# Patient Record
Sex: Female | Born: 1945
Health system: Southern US, Community
[De-identification: ages and names within clinical notes are randomized; demographics above are authoritative.]

## PROBLEM LIST (undated history)

## (undated) DIAGNOSIS — R062 Wheezing: Secondary | ICD-10-CM

## (undated) DIAGNOSIS — M1990 Unspecified osteoarthritis, unspecified site: Secondary | ICD-10-CM

## (undated) DIAGNOSIS — IMO0001 Reserved for inherently not codable concepts without codable children: Secondary | ICD-10-CM

## (undated) DIAGNOSIS — T8859XA Other complications of anesthesia, initial encounter: Secondary | ICD-10-CM

## (undated) DIAGNOSIS — R519 Headache, unspecified: Secondary | ICD-10-CM

## (undated) DIAGNOSIS — R112 Nausea with vomiting, unspecified: Secondary | ICD-10-CM

## (undated) DIAGNOSIS — C4491 Basal cell carcinoma of skin, unspecified: Secondary | ICD-10-CM

## (undated) DIAGNOSIS — J45909 Unspecified asthma, uncomplicated: Secondary | ICD-10-CM

## (undated) DIAGNOSIS — K219 Gastro-esophageal reflux disease without esophagitis: Secondary | ICD-10-CM

## (undated) DIAGNOSIS — I10 Essential (primary) hypertension: Secondary | ICD-10-CM

## (undated) DIAGNOSIS — C801 Malignant (primary) neoplasm, unspecified: Secondary | ICD-10-CM

## (undated) DIAGNOSIS — M810 Age-related osteoporosis without current pathological fracture: Secondary | ICD-10-CM

## (undated) DIAGNOSIS — S92902A Unspecified fracture of left foot, initial encounter for closed fracture: Secondary | ICD-10-CM

## (undated) DIAGNOSIS — I499 Cardiac arrhythmia, unspecified: Secondary | ICD-10-CM

## (undated) DIAGNOSIS — E785 Hyperlipidemia, unspecified: Secondary | ICD-10-CM

## (undated) DIAGNOSIS — T4145XA Adverse effect of unspecified anesthetic, initial encounter: Secondary | ICD-10-CM

## (undated) DIAGNOSIS — Z9889 Other specified postprocedural states: Secondary | ICD-10-CM

## (undated) DIAGNOSIS — K589 Irritable bowel syndrome without diarrhea: Secondary | ICD-10-CM

## (undated) DIAGNOSIS — Z8719 Personal history of other diseases of the digestive system: Secondary | ICD-10-CM

## (undated) HISTORY — PX: MOUTH SURGERY: SHX715

## (undated) HISTORY — PX: ABDOMINAL HYSTERECTOMY: SHX81

## (undated) HISTORY — PX: NASAL SINUS SURGERY: SHX719

## (undated) HISTORY — PX: TONSILLECTOMY: SUR1361

## (undated) HISTORY — DX: Basal cell carcinoma of skin, unspecified: C44.91

## (undated) HISTORY — PX: FOOT SURGERY: SHX648

## (undated) HISTORY — PX: HAND SURGERY: SHX662

## (undated) HISTORY — PX: HIP ARTHROPLASTY: SHX981

## (undated) HISTORY — PX: OTHER SURGICAL HISTORY: SHX169

## (undated) HISTORY — PX: ESOPHAGOGASTRODUODENOSCOPY: SHX1529

---

## 2004-10-03 ENCOUNTER — Ambulatory Visit: Payer: Self-pay | Admitting: Internal Medicine

## 2005-01-09 ENCOUNTER — Other Ambulatory Visit: Payer: Self-pay

## 2005-01-11 ENCOUNTER — Ambulatory Visit: Payer: Self-pay | Admitting: Unknown Physician Specialty

## 2005-11-12 ENCOUNTER — Ambulatory Visit: Payer: Self-pay | Admitting: Internal Medicine

## 2006-10-13 ENCOUNTER — Ambulatory Visit: Payer: Self-pay | Admitting: Internal Medicine

## 2007-01-05 ENCOUNTER — Ambulatory Visit: Payer: Self-pay | Admitting: Unknown Physician Specialty

## 2007-10-29 ENCOUNTER — Ambulatory Visit: Payer: Self-pay | Admitting: Internal Medicine

## 2008-11-24 ENCOUNTER — Ambulatory Visit: Payer: Self-pay | Admitting: Internal Medicine

## 2009-12-07 ENCOUNTER — Ambulatory Visit: Payer: Self-pay | Admitting: Internal Medicine

## 2010-12-12 ENCOUNTER — Ambulatory Visit: Payer: Self-pay | Admitting: Internal Medicine

## 2010-12-24 ENCOUNTER — Ambulatory Visit: Payer: Self-pay | Admitting: Internal Medicine

## 2011-12-05 ENCOUNTER — Ambulatory Visit: Payer: Self-pay | Admitting: Podiatry

## 2012-01-07 ENCOUNTER — Ambulatory Visit: Payer: Self-pay | Admitting: Internal Medicine

## 2013-02-10 ENCOUNTER — Ambulatory Visit: Payer: Self-pay | Admitting: Internal Medicine

## 2013-12-03 ENCOUNTER — Ambulatory Visit: Payer: Self-pay | Admitting: Unknown Physician Specialty

## 2013-12-08 LAB — PATHOLOGY REPORT

## 2014-03-14 ENCOUNTER — Ambulatory Visit: Payer: Self-pay | Admitting: Internal Medicine

## 2015-03-16 ENCOUNTER — Ambulatory Visit
Admit: 2015-03-16 | Disposition: A | Discharge status: Home / Self Care | Payer: Self-pay | Attending: Internal Medicine | Admitting: Internal Medicine

## 2015-08-11 ENCOUNTER — Emergency Department
Admission: EM | Admit: 2015-08-11 | Discharge: 2015-08-11 | Disposition: A | Discharge status: Home / Self Care | Payer: Medicare PPO | Attending: Emergency Medicine | Admitting: Emergency Medicine

## 2015-08-11 ENCOUNTER — Encounter: Payer: Self-pay | Admitting: Emergency Medicine

## 2015-08-11 ENCOUNTER — Emergency Department: Payer: Medicare PPO

## 2015-08-11 DIAGNOSIS — I10 Essential (primary) hypertension: Secondary | ICD-10-CM | POA: Diagnosis not present

## 2015-08-11 DIAGNOSIS — J45901 Unspecified asthma with (acute) exacerbation: Secondary | ICD-10-CM | POA: Diagnosis not present

## 2015-08-11 DIAGNOSIS — R079 Chest pain, unspecified: Secondary | ICD-10-CM | POA: Insufficient documentation

## 2015-08-11 HISTORY — DX: Essential (primary) hypertension: I10

## 2015-08-11 HISTORY — DX: Irritable bowel syndrome, unspecified: K58.9

## 2015-08-11 HISTORY — DX: Gastro-esophageal reflux disease without esophagitis: K21.9

## 2015-08-11 HISTORY — DX: Unspecified asthma, uncomplicated: J45.909

## 2015-08-11 LAB — CBC WITH DIFFERENTIAL/PLATELET
BASOS ABS: 0.1 10*3/uL (ref 0–0.1)
BASOS PCT: 1 %
EOS ABS: 0.1 10*3/uL (ref 0–0.7)
EOS PCT: 1 %
HCT: 37.5 % (ref 35.0–47.0)
Hemoglobin: 12.7 g/dL (ref 12.0–16.0)
Lymphocytes Relative: 19 %
Lymphs Abs: 1.7 10*3/uL (ref 1.0–3.6)
MCH: 30.5 pg (ref 26.0–34.0)
MCHC: 33.8 g/dL (ref 32.0–36.0)
MCV: 90.1 fL (ref 80.0–100.0)
MONO ABS: 0.8 10*3/uL (ref 0.2–0.9)
MONOS PCT: 9 %
NEUTROS ABS: 5.9 10*3/uL (ref 1.4–6.5)
Neutrophils Relative %: 70 %
PLATELETS: 325 10*3/uL (ref 150–440)
RBC: 4.16 MIL/uL (ref 3.80–5.20)
RDW: 14 % (ref 11.5–14.5)
WBC: 8.6 10*3/uL (ref 3.6–11.0)

## 2015-08-11 LAB — COMPREHENSIVE METABOLIC PANEL
ALBUMIN: 3.6 g/dL (ref 3.5–5.0)
ALK PHOS: 64 U/L (ref 38–126)
ALT: 22 U/L (ref 14–54)
ANION GAP: 10 (ref 5–15)
AST: 20 U/L (ref 15–41)
BILIRUBIN TOTAL: 0.3 mg/dL (ref 0.3–1.2)
BUN: 25 mg/dL — AB (ref 6–20)
CALCIUM: 9.1 mg/dL (ref 8.9–10.3)
CO2: 31 mmol/L (ref 22–32)
CREATININE: 1.08 mg/dL — AB (ref 0.44–1.00)
Chloride: 90 mmol/L — ABNORMAL LOW (ref 101–111)
GFR calc Af Amer: 59 mL/min — ABNORMAL LOW (ref >60)
GFR calc non Af Amer: 51 mL/min — ABNORMAL LOW (ref >60)
GLUCOSE: 92 mg/dL (ref 65–99)
Potassium: 3.3 mmol/L — ABNORMAL LOW (ref 3.5–5.1)
Sodium: 131 mmol/L — ABNORMAL LOW (ref 135–145)
TOTAL PROTEIN: 6.5 g/dL (ref 6.5–8.1)

## 2015-08-11 LAB — TROPONIN I

## 2015-08-11 MED ORDER — ASPIRIN EC 81 MG PO TBEC: 81.0000 mg | DELAYED_RELEASE_TABLET | Freq: Every day | ORAL | Status: AC

## 2015-08-11 MED ORDER — ASPIRIN 81 MG PO CHEW
324.0000 mg | CHEWABLE_TABLET | Freq: Once | ORAL | Status: AC
  Administered 2015-08-11: 324 mg via ORAL
  Filled 2015-08-11: qty 4

## 2015-08-11 NOTE — Discharge Instructions (Signed)

## 2015-08-11 NOTE — ED Notes (Signed)
Reports short episodes of cp off and on x 1 wk, states afterwards she feels very weak.  Denies cp at this time.

## 2015-08-11 NOTE — ED Provider Notes (Addendum)
[facility] Emergency Department Provider Note  ____________________________________________  Time seen: Approximately 093 PM  I have reviewed the triage vital signs and the nursing notes.   HISTORY  Chief Complaint Chest Pain    HPI Tracy Harvey is a 69 y.o. female with a history of hypertension and asthma who is presenting today with on and off chest pain over the past month. She said the pain is midsternal and nonradiating. She said it feels like a heaviness and can last from 5-10 minutes. It can happen at any time and is not worsened by exertion. 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 She denies any pain at this time. She says she [facility] doctor [facility] urgent [facility] and then she was sent to the emergency department. She denies any nausea vomiting or diaphoresis. Denies ever smoking. Has a history of heart disease in her family. Says she thought this may be indigestion but takes omeprazole as well as Tums without relief. Does have a history of vital hernia. Says that she has had chest pain in the 90s and had an echocardiogram that did not reveal any abnormality. However, and has not had any issues since then. Does not feel stressed at this time.   Past Medical History  Diagnosis Date  . GERD (gastroesophageal reflux disease)   . IBS (irritable bowel syndrome)   . Asthma   . Hypertension     There are no active problems to display for this patient.   Past Surgical History  Procedure Laterality Date  . Nasal sinus surgery      No current outpatient prescriptions on file.  Allergies Review of patient's allergies indicates not on file.  History reviewed. No pertinent family history.  Social History Social History  Substance Use  Topics  . Smoking status: Never Smoker   . Smokeless tobacco: None  . Alcohol Use: None    Review of Systems Constitutional: No fever/chills Eyes: No visual changes. ENT: No sore throat. Cardiovascular: As above Respiratory: Denies shortness of breath. Gastrointestinal: No abdominal pain.  No nausea, no vomiting.  No diarrhea.  No constipation. Genitourinary: Negative for dysuria. Musculoskeletal: Negative for back pain. Skin: Negative for rash. Neurological: Negative for headaches, focal weakness or numbness.  10-point ROS otherwise negative.  ____________________________________________   PHYSICAL EXAM:  VITAL SIGNS: ED Triage Vitals  Enc Vitals Group     BP 08/11/15 1716 164/77 mmHg     Pulse Rate 08/11/15 1716 66     Resp 08/11/15 1716 18     Temp 08/11/15 1716 98.6 F (37 C)     Temp Source 08/11/15 1716 Oral     SpO2 08/11/15 1716 98 %     Weight 08/11/15 1716 127 lb (57.607 kg)     Height 08/11/15 1716 5\' 2"  (1.575 m)     Head Cir --      Peak Flow --      Pain Score 08/11/15 1707 0     Pain Loc --      Pain Edu? --      Excl. in [place]? --     Constitutional: Alert and oriented. Well appearing and in no acute distress. Eyes: Conjunctivae are normal. PERRL. EOMI. Head: Atraumatic. Nose: No congestion/rhinnorhea. Mouth/Throat: Mucous membranes  are moist.  Oropharynx non-erythematous. Neck: No stridor.   Cardiovascular: Normal rate, regular rhythm. Grossly normal heart sounds.  Good peripheral circulation. Respiratory: Normal respiratory effort.  No retractions. Lungs CTAB. Gastrointestinal: Soft and nontender. No distention. No abdominal bruits. No CVA tenderness. Musculoskeletal: No lower extremity tenderness nor edema.  No joint effusions. Neurologic:  Normal speech and language. No gross focal neurologic deficits are appreciated. No gait instability. Skin:  Skin is warm, dry and intact. No rash noted. Psychiatric: Mood and affect are normal. Speech and  behavior are normal.  ____________________________________________   LABS (all labs ordered are listed, but only abnormal results are displayed)  Labs Reviewed  COMPREHENSIVE METABOLIC PANEL - Abnormal; Notable for the following:    Sodium 131 (*)    Potassium 3.3 (*)    Chloride 90 (*)    BUN 25 (*)    Creatinine, Ser 1.08 (*)    GFR calc non Af Amer 51 (*)    GFR calc Af Amer 59 (*)    All other components within normal limits  CBC WITH DIFFERENTIAL/PLATELET  TROPONIN I   ____________________________________________  EKG  ED ECG REPORT I, Doran Stabler, the attending physician, personally viewed and interpreted this ECG.   Date: 08/11/2015  EKG Time: 1709  Rate: 69  Rhythm: normal sinus rhythm with PACs  Axis: Normal axis  Intervals:none  ST&T Change: No ST elevation or depression. No abnormal T-wave inversion. Criteria for LVH  ____________________________________________  RADIOLOGY  No acute disease on her chest x-ray. ____________________________________________   PROCEDURES  ____________________________________________   INITIAL IMPRESSION / ASSESSMENT AND PLAN / ED COURSE  Pertinent labs & imaging results that were available during my care of the patient were reviewed by me and considered in my medical decision making (see chart for details).  ----------------------------------------- 10:21 PM on 08/11/2015 -----------------------------------------  Discussed the case with Dr. Nehemiah Massed who agrees to the patient in clinic on Monday at 1:30 PM. Discussed with the patient as well as her family member who is at the bedside. The patient understands reason for return such as worsening chest pain or any other concerning symptoms. After one month of intermittent pains with a very reassuring workup I believe the patient may be treated safely as an outpatient. She does have some risk factors however feels well at this time and has not had any worsening of  her symptoms. She just says it feels the same and finally decided she should have it checked out after it did not go away. The patient says she will be able to make the appointment this Monday 1:30 PM. ____________________________________________   FINAL CLINICAL IMPRESSION(S) / ED DIAGNOSES  Final diagnoses:  Chest pain      Orbie Pyo, MD 08/11/15 2223  We'll start patient on a baby aspirin.   Orbie Pyo, MD 08/11/15 2225

## 2016-01-23 ENCOUNTER — Encounter: Payer: Self-pay | Admitting: *Deleted

## 2016-01-25 NOTE — Pre-Procedure Instructions (Signed)
CLEARED BY DR Pryor Ochoa

## 2016-02-04 NOTE — H&P (Signed)
See scanned note.

## 2016-02-05 ENCOUNTER — Ambulatory Visit: Payer: Medicare PPO | Admitting: Anesthesiology

## 2016-02-05 ENCOUNTER — Ambulatory Visit
Admission: RE | Admit: 2016-02-05 | Discharge: 2016-02-05 | Disposition: A | Discharge status: Home / Self Care | Payer: Medicare PPO | Source: Ambulatory Visit | Attending: Ophthalmology | Admitting: Ophthalmology

## 2016-02-05 ENCOUNTER — Encounter
Admission: RE | Disposition: A | Discharge status: Home / Self Care | Payer: Self-pay | Source: Ambulatory Visit | Attending: Ophthalmology

## 2016-02-05 ENCOUNTER — Encounter: Payer: Self-pay | Admitting: Anesthesiology

## 2016-02-05 DIAGNOSIS — Z7982 Long term (current) use of aspirin: Secondary | ICD-10-CM | POA: Insufficient documentation

## 2016-02-05 DIAGNOSIS — Z881 Allergy status to other antibiotic agents status: Secondary | ICD-10-CM | POA: Insufficient documentation

## 2016-02-05 DIAGNOSIS — Z9889 Other specified postprocedural states: Secondary | ICD-10-CM | POA: Insufficient documentation

## 2016-02-05 DIAGNOSIS — J301 Allergic rhinitis due to pollen: Secondary | ICD-10-CM | POA: Insufficient documentation

## 2016-02-05 DIAGNOSIS — J45909 Unspecified asthma, uncomplicated: Secondary | ICD-10-CM | POA: Insufficient documentation

## 2016-02-05 DIAGNOSIS — M1990 Unspecified osteoarthritis, unspecified site: Secondary | ICD-10-CM | POA: Diagnosis not present

## 2016-02-05 DIAGNOSIS — Z791 Long term (current) use of non-steroidal anti-inflammatories (NSAID): Secondary | ICD-10-CM | POA: Insufficient documentation

## 2016-02-05 DIAGNOSIS — M810 Age-related osteoporosis without current pathological fracture: Secondary | ICD-10-CM | POA: Insufficient documentation

## 2016-02-05 DIAGNOSIS — Z79899 Other long term (current) drug therapy: Secondary | ICD-10-CM | POA: Diagnosis not present

## 2016-02-05 DIAGNOSIS — H2511 Age-related nuclear cataract, right eye: Secondary | ICD-10-CM | POA: Diagnosis not present

## 2016-02-05 DIAGNOSIS — H269 Unspecified cataract: Secondary | ICD-10-CM | POA: Diagnosis present

## 2016-02-05 HISTORY — DX: Unspecified osteoarthritis, unspecified site: M19.90

## 2016-02-05 HISTORY — DX: Adverse effect of unspecified anesthetic, initial encounter: T41.45XA

## 2016-02-05 HISTORY — DX: Wheezing: R06.2

## 2016-02-05 HISTORY — DX: Unspecified fracture of left foot, initial encounter for closed fracture: S92.902A

## 2016-02-05 HISTORY — PX: CATARACT EXTRACTION W/PHACO: SHX586

## 2016-02-05 HISTORY — DX: Personal history of other diseases of the digestive system: Z87.19

## 2016-02-05 HISTORY — DX: Other complications of anesthesia, initial encounter: T88.59XA

## 2016-02-05 HISTORY — DX: Other specified postprocedural states: Z98.890

## 2016-02-05 HISTORY — DX: Nausea with vomiting, unspecified: R11.2

## 2016-02-05 HISTORY — DX: Reserved for inherently not codable concepts without codable children: IMO0001

## 2016-02-05 SURGERY — PHACOEMULSIFICATION, CATARACT, WITH IOL INSERTION
Anesthesia: Monitor Anesthesia Care | Site: Eye | Laterality: Right | Wound class: Clean

## 2016-02-05 MED ORDER — CEFUROXIME OPHTHALMIC INJECTION 1 MG/0.1 ML
INJECTION | OPHTHALMIC | Status: DC | PRN
  Administered 2016-02-05: 0.1 mL via INTRACAMERAL

## 2016-02-05 MED ORDER — OXYCODONE HCL 5 MG/5ML PO SOLN: 5.0000 mg | Freq: Once | ORAL | Status: DC | PRN

## 2016-02-05 MED ORDER — MIDAZOLAM HCL 2 MG/2ML IJ SOLN
INTRAMUSCULAR | Status: DC | PRN
  Administered 2016-02-05 (×2): 0.5 mg via INTRAVENOUS

## 2016-02-05 MED ORDER — PHENYLEPHRINE HCL 10 % OP SOLN
OPHTHALMIC | Status: AC
  Administered 2016-02-05: 1 [drp] via OPHTHALMIC
  Filled 2016-02-05: qty 5

## 2016-02-05 MED ORDER — LIDOCAINE HCL (PF) 4 % IJ SOLN
INTRAMUSCULAR | Status: DC | PRN
  Administered 2016-02-05: 4 mL via OPHTHALMIC

## 2016-02-05 MED ORDER — MOXIFLOXACIN HCL 0.5 % OP SOLN
1.0000 [drp] | OPHTHALMIC | Status: AC | PRN
  Administered 2016-02-05 (×3): 1 [drp] via OPHTHALMIC

## 2016-02-05 MED ORDER — LIDOCAINE HCL (PF) 4 % IJ SOLN
INTRAOCULAR | Status: DC | PRN
  Administered 2016-02-05: .5 mL via OPHTHALMIC

## 2016-02-05 MED ORDER — MOXIFLOXACIN HCL 0.5 % OP SOLN
OPHTHALMIC | Status: AC
  Administered 2016-02-05: 1 [drp] via OPHTHALMIC
  Filled 2016-02-05: qty 3

## 2016-02-05 MED ORDER — CYCLOPENTOLATE HCL 2 % OP SOLN
1.0000 [drp] | OPHTHALMIC | Status: AC | PRN
  Administered 2016-02-05 (×4): 1 [drp] via OPHTHALMIC

## 2016-02-05 MED ORDER — ONDANSETRON HCL 4 MG/2ML IJ SOLN
INTRAMUSCULAR | Status: DC | PRN
  Administered 2016-02-05: 4 mg via INTRAVENOUS

## 2016-02-05 MED ORDER — PHENYLEPHRINE HCL 10 % OP SOLN
1.0000 [drp] | OPHTHALMIC | Status: AC | PRN
  Administered 2016-02-05 (×4): 1 [drp] via OPHTHALMIC

## 2016-02-05 MED ORDER — LIDOCAINE HCL (PF) 4 % IJ SOLN
INTRAMUSCULAR | Status: AC
Start: 2016-02-05 — End: 2016-02-05
  Filled 2016-02-05: qty 5

## 2016-02-05 MED ORDER — CYCLOPENTOLATE HCL 2 % OP SOLN
OPHTHALMIC | Status: AC
  Administered 2016-02-05: 1 [drp] via OPHTHALMIC
  Filled 2016-02-05: qty 2

## 2016-02-05 MED ORDER — CEFUROXIME OPHTHALMIC INJECTION 1 MG/0.1 ML
INJECTION | OPHTHALMIC | Status: AC
  Filled 2016-02-05: qty 0.1

## 2016-02-05 MED ORDER — TETRACAINE HCL 0.5 % OP SOLN
OPHTHALMIC | Status: DC | PRN
  Administered 2016-02-05: 1 [drp] via OPHTHALMIC

## 2016-02-05 MED ORDER — EPINEPHRINE HCL 1 MG/ML IJ SOLN
INTRAMUSCULAR | Status: DC | PRN
  Administered 2016-02-05: 1 mL via OPHTHALMIC

## 2016-02-05 MED ORDER — OXYCODONE HCL 5 MG PO TABS: 5.0000 mg | ORAL_TABLET | Freq: Once | ORAL | Status: DC | PRN

## 2016-02-05 MED ORDER — MOXIFLOXACIN HCL 0.5 % OP SOLN
OPHTHALMIC | Status: DC | PRN
  Administered 2016-02-05: 1 [drp] via OPHTHALMIC

## 2016-02-05 MED ORDER — ALFENTANIL 500 MCG/ML IJ INJ
INJECTION | INTRAMUSCULAR | Status: DC | PRN
  Administered 2016-02-05: 500 ug via INTRAVENOUS

## 2016-02-05 MED ORDER — SODIUM CHLORIDE 0.9 % IV SOLN
INTRAVENOUS | Status: DC
  Administered 2016-02-05: 09:00:00 via INTRAVENOUS

## 2016-02-05 MED ORDER — TETRACAINE HCL 0.5 % OP SOLN
OPHTHALMIC | Status: AC
  Filled 2016-02-05: qty 2

## 2016-02-05 MED ORDER — MEPERIDINE HCL 25 MG/ML IJ SOLN: 6.2500 mg | INTRAMUSCULAR | Status: DC | PRN

## 2016-02-05 MED ORDER — NA CHONDROIT SULF-NA HYALURON 40-17 MG/ML IO SOLN
INTRAOCULAR | Status: DC | PRN
  Administered 2016-02-05: 1 mL via INTRAOCULAR

## 2016-02-05 MED ORDER — POVIDONE-IODINE 5 % OP SOLN
OPHTHALMIC | Status: AC
  Filled 2016-02-05: qty 30

## 2016-02-05 MED ORDER — CARBACHOL 0.01 % IO SOLN
INTRAOCULAR | Status: DC | PRN
  Administered 2016-02-05: 0.5 mL via INTRAOCULAR

## 2016-02-05 MED ORDER — BUPIVACAINE HCL (PF) 0.75 % IJ SOLN
INTRAMUSCULAR | Status: AC
  Filled 2016-02-05: qty 10

## 2016-02-05 MED ORDER — HYALURONIDASE HUMAN 150 UNIT/ML IJ SOLN
INTRAMUSCULAR | Status: AC
  Filled 2016-02-05: qty 1

## 2016-02-05 MED ORDER — POVIDONE-IODINE 5 % OP SOLN
OPHTHALMIC | Status: DC | PRN
  Administered 2016-02-05: 1 via OPHTHALMIC

## 2016-02-05 MED ORDER — EPINEPHRINE HCL 1 MG/ML IJ SOLN
INTRAMUSCULAR | Status: AC
  Filled 2016-02-05: qty 2

## 2016-02-05 MED ORDER — NA CHONDROIT SULF-NA HYALURON 40-17 MG/ML IO SOLN
INTRAOCULAR | Status: AC
  Filled 2016-02-05: qty 1

## 2016-02-05 MED ORDER — ONDANSETRON HCL 4 MG/2ML IJ SOLN: 4.0000 mg | Freq: Once | INTRAMUSCULAR | Status: DC | PRN

## 2016-02-05 SURGICAL SUPPLY — 30 items
2.6 SLIT KNIFE ×3 IMPLANT
CANNULA ANT/CHMB 27GA (MISCELLANEOUS) ×3 IMPLANT
CORD BIP STRL DISP 12FT (MISCELLANEOUS) ×3 IMPLANT
CUP MEDICINE 2OZ PLAST GRAD ST (MISCELLANEOUS) ×3 IMPLANT
DRAPE XRAY CASSETTE 23X24 (DRAPES) ×3 IMPLANT
ERASER HMR WETFIELD 18G (MISCELLANEOUS) ×3 IMPLANT
GLOVE BIO SURGEON STRL SZ8 (GLOVE) ×3 IMPLANT
GLOVE SURG LX 6.5 MICRO (GLOVE) ×2
GLOVE SURG LX 8.0 MICRO (GLOVE) ×2
GLOVE SURG LX STRL 6.5 MICRO (GLOVE) ×1 IMPLANT
GLOVE SURG LX STRL 8.0 MICRO (GLOVE) ×1 IMPLANT
GOWN STRL REUS W/ TWL LRG LVL3 (GOWN DISPOSABLE) ×1 IMPLANT
GOWN STRL REUS W/ TWL XL LVL3 (GOWN DISPOSABLE) ×1 IMPLANT
GOWN STRL REUS W/TWL LRG LVL3 (GOWN DISPOSABLE) ×2
GOWN STRL REUS W/TWL XL LVL3 (GOWN DISPOSABLE) ×2
LENS IOL ACRYSOF IQ 19.5 (Intraocular Lens) ×3 IMPLANT
PACK CATARACT (MISCELLANEOUS) ×3 IMPLANT
PACK CATARACT DINGLEDEIN LX (MISCELLANEOUS) ×3 IMPLANT
PACK EYE AFTER SURG (MISCELLANEOUS) ×3 IMPLANT
SHLD EYE VISITEC  UNIV (MISCELLANEOUS) ×3 IMPLANT
SOL BSS BAG (MISCELLANEOUS) ×3
SOL PREP PVP 2OZ (MISCELLANEOUS) ×3
SOLUTION BSS BAG (MISCELLANEOUS) ×1 IMPLANT
SOLUTION PREP PVP 2OZ (MISCELLANEOUS) ×1 IMPLANT
SUT SILK 5-0 (SUTURE) ×3 IMPLANT
SYR 3ML LL SCALE MARK (SYRINGE) ×3 IMPLANT
SYR 5ML LL (SYRINGE) ×3 IMPLANT
SYR TB 1ML 27GX1/2 LL (SYRINGE) ×3 IMPLANT
WATER STERILE IRR 1000ML POUR (IV SOLUTION) ×3 IMPLANT
WIPE NON LINTING 3.25X3.25 (MISCELLANEOUS) ×3 IMPLANT

## 2016-02-05 NOTE — Anesthesia Postprocedure Evaluation (Signed)
Anesthesia Post Note  Patient: Tracy Harvey  Procedure(s) Performed: Procedure(s) (LRB): CATARACT EXTRACTION PHACO AND INTRAOCULAR LENS PLACEMENT (IOC) (Right)  Patient location during evaluation: PACU Anesthesia Type: MAC Level of consciousness: awake Pain management: pain level controlled Vital Signs Assessment: post-procedure vital signs reviewed and stable Respiratory status: spontaneous breathing Cardiovascular status: stable Postop Assessment: no signs of nausea or vomiting and adequate PO intake Anesthetic complications: no    Last Vitals:  Filed Vitals:   02/05/16 1120 02/05/16 1131  BP: 165/84 165/87  Pulse: 64   Temp: 36.5 C   Resp: 18     Last Pain: There were no vitals filed for this visit.               [name]

## 2016-02-05 NOTE — Op Note (Signed)
Date of Surgery: 02/05/2016 Date of Dictation: 02/05/2016 11:22 AM Pre-operative Diagnosis:  Nuclear Sclerotic Cataract right Eye Post-operative Diagnosis: same Procedure performed: Extra-capsular Cataract Extraction (ECCE) with placement of a posterior chamber intraocular lens (IOL) right Eye IOL:  Implant Name Type Inv. Item Serial No. Manufacturer Lot No. LRB No. Used  LENS IOL ACRYSOF IQ 19.5 - [id number] Intraocular Lens LENS IOL ACRYSOF IQ 19.5 [id number] ALCON   Right 1   Anesthesia: 2% Lidocaine and 4% Marcaine in a 50/50 mixture with 10 unites/ml of Hylenex given as a peribulbar Anesthesiologist: Anesthesiologist: Lyn Hollingshead, MD CRNA: Courtney Paris, CRNA Complications: none Estimated Blood Loss: less than 1 ml  Description of procedure:  The patient was given anesthesia and sedation via intravenous access. The patient was then prepped and draped in the usual fashion. A 25-gauge needle was bent for initiating the capsulorhexis. A 5-0 silk suture was placed through the conjunctiva superior and inferiorly to serve as bridle sutures. Hemostasis was obtained at the superior limbus using an eraser cautery. A partial thickness groove was made at the anterior surgical limbus with a 64 Beaver blade and this was dissected anteriorly with an Avaya. The anterior chamber was entered at 10 o'clock with a 1.0 mm paracentesis knife and through the lamellar dissection with a 2.6 mm Alcon keratome. Epi-Shugarcaine 0.5 CC [9 cc BSS Plus (Alcon), 3 cc 4% preservative-free lidocaine (Hospira) and 4 cc 1:1000 preservative-free, bisulfite-free epinephrine] was injected into the anterior chamber via the paracentesis tract. Epi-Shugarcaine 0.5 CC [9 cc BSS Plus (Alcon), 3 cc 4% preservative-free lidocaine (Hospira) and 4 cc 1:1000 preservative-free, bisulfite-free epinephrine] was injected into the anterior chamber via the paracentesis tract. DiscoVisc was injected to replace the aqueous and  a continuous tear curvilinear capsulorhexis was performed using a bent 25-gauge needle.  Balance salt on a syringe was used to perform hydro-dissection and phacoemulsification was carried out using a divide and conquer technique. Procedure(s) with comments: CATARACT EXTRACTION PHACO AND INTRAOCULAR LENS PLACEMENT (IOC) (Right) - [place] 01:02 AP% 23.9 CDE 25.0 fluid pack lot # [id number]. Irrigation/aspiration was used to remove the residual cortex and the capsular bag was inflated with DiscoVisc. The intraocular lens was inserted into the capsular bag using a pre-loaded UltraSert Delivery System. Irrigation/aspiration was used to remove the residual DiscoVisc. The wound was inflated with balanced salt and checked for leaks. None were found. Miostat was injected via the paracentesis track and 0.1 ml of cefuroxime containing 1 mg of drug  was injected via the paracentesis track. The wound was checked for leaks again and none were found.   The bridal sutures were removed and two drops of Vigamox were placed on the eye. An eye shield was placed to protect the eye and the patient was discharged to the recovery area in good condition.   Fidel Caggiano MD

## 2016-02-05 NOTE — Transfer of Care (Signed)
Immediate Anesthesia Transfer of Care Note  Patient: Tracy Harvey  Procedure(s) Performed: Procedure(s) with comments: CATARACT EXTRACTION PHACO AND INTRAOCULAR LENS PLACEMENT (IOC) (Right) - [place] 01:02 AP% 23.9 CDE 25.0 fluid pack lot # [id number]  Patient Location: PACU and Short Stay  Anesthesia Type:MAC  Level of Consciousness: awake, alert  and patient cooperative  Airway & Oxygen Therapy: Patient Spontanous Breathing  Post-op Assessment: Report given to RN and Post -op Vital signs reviewed and stable  Post vital signs: Reviewed and stable  Last Vitals:  Filed Vitals:   02/05/16 0902 02/05/16 1029  BP: 217/93 217/79  Pulse: 64   Temp: 35.7 C   Resp: 20     Complications: No apparent anesthesia complications

## 2016-02-05 NOTE — Discharge Instructions (Signed)
Eye Surgery Discharge Instructions  Expect mild scratchy sensation or mild soreness. DO NOT RUB YOUR EYE!  The day of surgery:  Minimal physical activity, but bed rest is not required  No reading, computer work, or close hand work  No bending, lifting, or straining.  May watch TV  For 24 hours:  No driving, legal decisions, or alcoholic beverages  Safety precautions  Eat anything you prefer: It is better to start with liquids, then soup then solid foods.  _____ Eye patch should be worn until postoperative exam tomorrow.  ____ Solar shield eyeglasses should be worn for comfort in the sunlight/patch while sleeping  Resume all regular medications including aspirin or Coumadin if these were discontinued prior to surgery. You may shower, bathe, shave, or wash your hair. Tylenol may be taken for mild discomfort.  Call your doctor if you experience significant pain, nausea, or vomiting, fever > 101 or other signs of infection. [phone] or [phone] Specific instructions:  Follow-up Information    Follow up with Estill Cotta, MD.   Specialty:  Ophthalmology   Why:  follow up 3/21 at Yoe information:   [place] [address] [phone]     AMBULATORY SURGERY  DISCHARGE INSTRUCTIONS   1) The drugs that you were given will stay in your system until tomorrow so for the next 24 hours you should not:  A) Drive an automobile B) Make any legal decisions C) Drink any alcoholic beverage   2) You may resume regular meals tomorrow.  Today it is better to start with liquids and gradually work up to solid foods.  You may eat anything you prefer, but it is better to start with liquids, then soup and crackers, and gradually work up to solid foods.   3) Please notify your doctor immediately if you have any unusual bleeding, trouble breathing, redness and pain at the surgery site, drainage, fever, or pain not relieved by  medication.    4) Additional Instructions:        Please contact your physician with any problems or Same Day Surgery at [phone], Monday through Friday 6 am to 4 pm, or [place] at [facility] number at [phone].

## 2016-02-05 NOTE — Interval H&P Note (Signed)
History and Physical Interval Note:  02/05/2016 10:36 AM  Tracy Harvey  has presented today for surgery, with the diagnosis of CATARACT  The various methods of treatment have been discussed with the patient and family. After consideration of risks, benefits and other options for treatment, the patient has consented to  Procedure(s): CATARACT EXTRACTION PHACO AND INTRAOCULAR LENS PLACEMENT ([place]) (Right) as a surgical intervention .  The patient's history has been reviewed, patient examined, no change in status, stable for surgery.  I have reviewed the patient's chart and labs.  Questions were answered to the patient's satisfaction.     Canary Fister

## 2016-02-05 NOTE — Anesthesia Preprocedure Evaluation (Signed)
Anesthesia Evaluation  Patient identified by MRN, date of birth, ID band Patient awake    Reviewed: Allergy & Precautions, H&P , NPO status , Patient's Chart, lab work & pertinent test results  Airway Mallampati: II  TM Distance: >3 FB Neck ROM: full    Dental no notable dental hx.    Pulmonary asthma ,  Uses an inhaler   Pulmonary exam normal breath sounds clear to auscultation       Cardiovascular Exercise Tolerance: Good hypertension, Pt. on medications Normal cardiovascular exam     Neuro/Psych negative neurological ROS  negative psych ROS   GI/Hepatic Neg liver ROS, hiatal hernia, GERD  Medicated and Controlled,  Endo/Other  negative endocrine ROS  Renal/GU negative Renal ROS  negative genitourinary   Musculoskeletal   Abdominal Normal abdominal exam  (+)   Peds  Hematology negative hematology ROS (+)   Anesthesia Other Findings   Reproductive/Obstetrics negative OB ROS                             Anesthesia Physical Anesthesia Plan  ASA: II  Anesthesia Plan: MAC   Post-op Pain Management:    Induction: Intravenous  Airway Management Planned: Nasal Cannula  Additional Equipment:   Intra-op Plan:   Post-operative Plan:   Informed Consent: I have reviewed the patients History and Physical, chart, labs and discussed the procedure including the risks, benefits and alternatives for the proposed anesthesia with the patient or authorized representative who has indicated his/her understanding and acceptance.     Plan Discussed with: CRNA and Surgeon  Anesthesia Plan Comments:         Anesthesia Quick Evaluation

## 2016-02-19 ENCOUNTER — Other Ambulatory Visit: Payer: Self-pay | Admitting: Internal Medicine

## 2016-02-19 DIAGNOSIS — Z1231 Encounter for screening mammogram for malignant neoplasm of breast: Secondary | ICD-10-CM

## 2016-03-19 ENCOUNTER — Ambulatory Visit
Admission: RE | Admit: 2016-03-19 | Discharge: 2016-03-19 | Disposition: A | Discharge status: Home / Self Care | Payer: Medicare PPO | Source: Ambulatory Visit | Attending: Internal Medicine | Admitting: Internal Medicine

## 2016-03-19 ENCOUNTER — Other Ambulatory Visit: Payer: Self-pay | Admitting: Internal Medicine

## 2016-03-19 DIAGNOSIS — Z1231 Encounter for screening mammogram for malignant neoplasm of breast: Secondary | ICD-10-CM | POA: Diagnosis present

## 2016-11-19 DIAGNOSIS — S92354D Nondisplaced fracture of fifth metatarsal bone, right foot, subsequent encounter for fracture with routine healing: Secondary | ICD-10-CM | POA: Diagnosis not present

## 2016-12-03 DIAGNOSIS — S92354D Nondisplaced fracture of fifth metatarsal bone, right foot, subsequent encounter for fracture with routine healing: Secondary | ICD-10-CM | POA: Diagnosis not present

## 2016-12-24 DIAGNOSIS — S92354D Nondisplaced fracture of fifth metatarsal bone, right foot, subsequent encounter for fracture with routine healing: Secondary | ICD-10-CM | POA: Diagnosis not present

## 2017-01-08 DIAGNOSIS — S92354D Nondisplaced fracture of fifth metatarsal bone, right foot, subsequent encounter for fracture with routine healing: Secondary | ICD-10-CM | POA: Diagnosis not present

## 2017-01-10 DIAGNOSIS — M818 Other osteoporosis without current pathological fracture: Secondary | ICD-10-CM | POA: Diagnosis not present

## 2017-01-14 DIAGNOSIS — H33311 Horseshoe tear of retina without detachment, right eye: Secondary | ICD-10-CM | POA: Diagnosis not present

## 2017-01-14 DIAGNOSIS — H33319 Horseshoe tear of retina without detachment, unspecified eye: Secondary | ICD-10-CM | POA: Diagnosis not present

## 2017-01-15 DIAGNOSIS — H33311 Horseshoe tear of retina without detachment, right eye: Secondary | ICD-10-CM | POA: Diagnosis not present

## 2017-01-15 DIAGNOSIS — H43391 Other vitreous opacities, right eye: Secondary | ICD-10-CM | POA: Diagnosis not present

## 2017-01-15 DIAGNOSIS — H25012 Cortical age-related cataract, left eye: Secondary | ICD-10-CM | POA: Diagnosis not present

## 2017-02-04 ENCOUNTER — Other Ambulatory Visit: Payer: Self-pay | Admitting: Internal Medicine

## 2017-02-04 DIAGNOSIS — Z1231 Encounter for screening mammogram for malignant neoplasm of breast: Secondary | ICD-10-CM

## 2017-03-24 ENCOUNTER — Ambulatory Visit
Admission: RE | Admit: 2017-03-24 | Discharge: 2017-03-24 | Disposition: A | Discharge status: Home / Self Care | Payer: Medicare PPO | Source: Ambulatory Visit | Attending: Internal Medicine | Admitting: Internal Medicine

## 2017-03-24 DIAGNOSIS — Z1231 Encounter for screening mammogram for malignant neoplasm of breast: Secondary | ICD-10-CM | POA: Diagnosis not present

## 2017-11-19 DIAGNOSIS — G5701 Lesion of sciatic nerve, right lower limb: Secondary | ICD-10-CM | POA: Diagnosis not present

## 2017-11-19 DIAGNOSIS — M7061 Trochanteric bursitis, right hip: Secondary | ICD-10-CM | POA: Diagnosis not present

## 2017-11-19 DIAGNOSIS — M21962 Unspecified acquired deformity of left lower leg: Secondary | ICD-10-CM | POA: Diagnosis not present

## 2017-11-19 DIAGNOSIS — R600 Localized edema: Secondary | ICD-10-CM | POA: Diagnosis not present

## 2017-12-03 DIAGNOSIS — S92325D Nondisplaced fracture of second metatarsal bone, left foot, subsequent encounter for fracture with routine healing: Secondary | ICD-10-CM | POA: Diagnosis not present

## 2017-12-22 DIAGNOSIS — S92325D Nondisplaced fracture of second metatarsal bone, left foot, subsequent encounter for fracture with routine healing: Secondary | ICD-10-CM | POA: Diagnosis not present

## 2018-01-14 DIAGNOSIS — S92325D Nondisplaced fracture of second metatarsal bone, left foot, subsequent encounter for fracture with routine healing: Secondary | ICD-10-CM | POA: Diagnosis not present

## 2018-02-09 DIAGNOSIS — K591 Functional diarrhea: Secondary | ICD-10-CM | POA: Diagnosis not present

## 2018-02-09 DIAGNOSIS — J0100 Acute maxillary sinusitis, unspecified: Secondary | ICD-10-CM | POA: Diagnosis not present

## 2018-02-09 DIAGNOSIS — J4540 Moderate persistent asthma, uncomplicated: Secondary | ICD-10-CM | POA: Diagnosis not present

## 2018-02-25 DIAGNOSIS — J301 Allergic rhinitis due to pollen: Secondary | ICD-10-CM | POA: Diagnosis not present

## 2018-02-25 DIAGNOSIS — J320 Chronic maxillary sinusitis: Secondary | ICD-10-CM | POA: Diagnosis not present

## 2018-02-25 DIAGNOSIS — H6062 Unspecified chronic otitis externa, left ear: Secondary | ICD-10-CM | POA: Diagnosis not present

## 2018-03-02 DIAGNOSIS — E782 Mixed hyperlipidemia: Secondary | ICD-10-CM | POA: Diagnosis not present

## 2018-03-02 DIAGNOSIS — Z79899 Other long term (current) drug therapy: Secondary | ICD-10-CM | POA: Diagnosis not present

## 2018-03-09 DIAGNOSIS — M818 Other osteoporosis without current pathological fracture: Secondary | ICD-10-CM | POA: Diagnosis not present

## 2018-03-09 DIAGNOSIS — Z79899 Other long term (current) drug therapy: Secondary | ICD-10-CM | POA: Diagnosis not present

## 2018-03-09 DIAGNOSIS — M7061 Trochanteric bursitis, right hip: Secondary | ICD-10-CM | POA: Diagnosis not present

## 2018-03-09 DIAGNOSIS — Z0000 Encounter for general adult medical examination without abnormal findings: Secondary | ICD-10-CM | POA: Diagnosis not present

## 2018-03-11 DIAGNOSIS — R202 Paresthesia of skin: Secondary | ICD-10-CM | POA: Diagnosis not present

## 2018-03-11 DIAGNOSIS — D692 Other nonthrombocytopenic purpura: Secondary | ICD-10-CM | POA: Diagnosis not present

## 2018-03-11 DIAGNOSIS — L821 Other seborrheic keratosis: Secondary | ICD-10-CM | POA: Diagnosis not present

## 2018-03-25 ENCOUNTER — Other Ambulatory Visit: Payer: Self-pay | Admitting: Internal Medicine

## 2018-03-25 DIAGNOSIS — Z1231 Encounter for screening mammogram for malignant neoplasm of breast: Secondary | ICD-10-CM

## 2018-03-31 DIAGNOSIS — K146 Glossodynia: Secondary | ICD-10-CM | POA: Diagnosis not present

## 2018-04-29 ENCOUNTER — Ambulatory Visit
Admission: RE | Admit: 2018-04-29 | Discharge: 2018-04-29 | Disposition: A | Discharge status: Home / Self Care | Payer: Medicare HMO | Source: Ambulatory Visit | Attending: Internal Medicine | Admitting: Internal Medicine

## 2018-04-29 DIAGNOSIS — Z1231 Encounter for screening mammogram for malignant neoplasm of breast: Secondary | ICD-10-CM | POA: Insufficient documentation

## 2018-05-26 DIAGNOSIS — M5430 Sciatica, unspecified side: Secondary | ICD-10-CM | POA: Diagnosis not present

## 2018-06-29 DIAGNOSIS — J019 Acute sinusitis, unspecified: Secondary | ICD-10-CM | POA: Diagnosis not present

## 2018-07-07 DIAGNOSIS — L565 Disseminated superficial actinic porokeratosis (DSAP): Secondary | ICD-10-CM | POA: Diagnosis not present

## 2018-08-31 DIAGNOSIS — Z79899 Other long term (current) drug therapy: Secondary | ICD-10-CM | POA: Diagnosis not present

## 2018-09-07 DIAGNOSIS — J0101 Acute recurrent maxillary sinusitis: Secondary | ICD-10-CM | POA: Diagnosis not present

## 2018-09-07 DIAGNOSIS — J4540 Moderate persistent asthma, uncomplicated: Secondary | ICD-10-CM | POA: Diagnosis not present

## 2018-09-07 DIAGNOSIS — E782 Mixed hyperlipidemia: Secondary | ICD-10-CM | POA: Diagnosis not present

## 2018-09-07 DIAGNOSIS — Z79899 Other long term (current) drug therapy: Secondary | ICD-10-CM | POA: Diagnosis not present

## 2018-09-23 DIAGNOSIS — Z961 Presence of intraocular lens: Secondary | ICD-10-CM | POA: Diagnosis not present

## 2019-02-02 ENCOUNTER — Encounter: Payer: Self-pay | Admitting: *Deleted

## 2019-02-03 ENCOUNTER — Encounter
Admission: RE | Disposition: A | Discharge status: Home / Self Care | Payer: Self-pay | Source: Home / Self Care | Attending: Unknown Physician Specialty

## 2019-02-03 ENCOUNTER — Encounter: Payer: Self-pay | Admitting: *Deleted

## 2019-02-03 ENCOUNTER — Other Ambulatory Visit: Payer: Self-pay

## 2019-02-03 ENCOUNTER — Ambulatory Visit: Payer: Medicare Other | Admitting: Anesthesiology

## 2019-02-03 ENCOUNTER — Ambulatory Visit
Admission: RE | Admit: 2019-02-03 | Discharge: 2019-02-03 | Disposition: A | Discharge status: Home / Self Care | Payer: Medicare Other | Attending: Unknown Physician Specialty | Admitting: Unknown Physician Specialty

## 2019-02-03 DIAGNOSIS — Z7951 Long term (current) use of inhaled steroids: Secondary | ICD-10-CM | POA: Diagnosis not present

## 2019-02-03 DIAGNOSIS — Z79890 Hormone replacement therapy (postmenopausal): Secondary | ICD-10-CM | POA: Diagnosis not present

## 2019-02-03 DIAGNOSIS — K449 Diaphragmatic hernia without obstruction or gangrene: Secondary | ICD-10-CM | POA: Insufficient documentation

## 2019-02-03 DIAGNOSIS — Z1211 Encounter for screening for malignant neoplasm of colon: Secondary | ICD-10-CM | POA: Insufficient documentation

## 2019-02-03 DIAGNOSIS — Z79899 Other long term (current) drug therapy: Secondary | ICD-10-CM | POA: Diagnosis not present

## 2019-02-03 DIAGNOSIS — K640 First degree hemorrhoids: Secondary | ICD-10-CM | POA: Diagnosis not present

## 2019-02-03 DIAGNOSIS — K219 Gastro-esophageal reflux disease without esophagitis: Secondary | ICD-10-CM | POA: Diagnosis not present

## 2019-02-03 DIAGNOSIS — Z7983 Long term (current) use of bisphosphonates: Secondary | ICD-10-CM | POA: Diagnosis not present

## 2019-02-03 DIAGNOSIS — Z86010 Personal history of colonic polyps: Secondary | ICD-10-CM | POA: Insufficient documentation

## 2019-02-03 DIAGNOSIS — Z7982 Long term (current) use of aspirin: Secondary | ICD-10-CM | POA: Diagnosis not present

## 2019-02-03 DIAGNOSIS — J45909 Unspecified asthma, uncomplicated: Secondary | ICD-10-CM | POA: Insufficient documentation

## 2019-02-03 DIAGNOSIS — I10 Essential (primary) hypertension: Secondary | ICD-10-CM | POA: Diagnosis not present

## 2019-02-03 HISTORY — PX: COLONOSCOPY WITH PROPOFOL: SHX5780

## 2019-02-03 SURGERY — COLONOSCOPY WITH PROPOFOL
Anesthesia: General

## 2019-02-03 MED ORDER — PROPOFOL 10 MG/ML IV BOLUS
INTRAVENOUS | Status: DC | PRN
  Administered 2019-02-03: 50 mg via INTRAVENOUS
  Administered 2019-02-03: 40 mg via INTRAVENOUS
  Administered 2019-02-03: 30 mg via INTRAVENOUS

## 2019-02-03 MED ORDER — SODIUM CHLORIDE 0.9 % IV SOLN: INTRAVENOUS | Status: DC

## 2019-02-03 MED ORDER — SODIUM CHLORIDE 0.9 % IV SOLN
INTRAVENOUS | Status: DC
  Administered 2019-02-03: 1000 mL via INTRAVENOUS

## 2019-02-03 MED ORDER — PROPOFOL 10 MG/ML IV BOLUS
INTRAVENOUS | Status: AC
  Filled 2019-02-03: qty 20

## 2019-02-03 MED ORDER — PROPOFOL 500 MG/50ML IV EMUL
INTRAVENOUS | Status: DC | PRN
  Administered 2019-02-03: 140 ug/kg/min via INTRAVENOUS

## 2019-02-03 MED ORDER — PROPOFOL 500 MG/50ML IV EMUL
INTRAVENOUS | Status: AC
  Filled 2019-02-03: qty 50

## 2019-02-03 NOTE — Anesthesia Post-op Follow-up Note (Signed)
Anesthesia QCDR form completed.        

## 2019-02-03 NOTE — Op Note (Signed)
[facility] Gastroenterology Patient Name: Tracy Harvey Procedure Date: 02/03/2019 8:38 AM MRN: [id number] Account #: [id number] Date of Birth: 1946/09/02 Admit Type: Outpatient Age: 73 Room: [facility] ENDO ROOM 2 Gender: Female Note Status: Finalized Procedure:            Colonoscopy Indications:          High risk colon cancer surveillance: Personal history                        of colonic polyps Providers:            Manya Silvas, MD Referring MD:         Rusty Aus, MD (Referring MD) Medicines:            Propofol per Anesthesia Complications:        No immediate complications. Procedure:            Pre-Anesthesia Assessment:                       - After reviewing the risks and benefits, the patient                        was deemed in satisfactory condition to undergo the                        procedure.                       After obtaining informed consent, the colonoscope was                        passed under direct vision. Throughout the procedure,                        the patient's blood pressure, pulse, and oxygen                        saturations were monitored continuously. The                        Colonoscope was introduced through the anus and                        advanced to the the cecum, identified by appendiceal                        orifice and ileocecal valve. The colonoscopy was                        performed without difficulty. The patient tolerated the                        procedure well. The quality of the bowel preparation                        was excellent. Findings:      Internal hemorrhoids were found during endoscopy. The hemorrhoids were       Grade I (internal hemorrhoids that do not prolapse). The prep was       excellent.      The exam was otherwise without abnormality. Impression:           -  Internal hemorrhoids.                       - The examination was otherwise normal.                       - No  specimens collected. Recommendation:       - Repeat colonoscopy in 5 years for surveillance. Manya Silvas, MD 02/03/2019 9:20:31 AM This report has been signed electronically. Number of Addenda: 0 Note Initiated On: 02/03/2019 8:38 AM Scope Withdrawal Time: 0 hours 10 minutes 17 seconds  Total Procedure Duration: 0 hours 21 minutes 12 seconds       [facility]

## 2019-02-03 NOTE — H&P (Signed)
Primary Care Physician:  Rusty Aus, MD Primary Gastroenterologist:  Dr. Vira Agar  Pre-Procedure History & Physical: HPI:  Tracy Harvey is a 73 y.o. female is here for an colonoscopy.   Past Medical History:  Diagnosis Date  . Arthritis   . Asthma   . Complication of anesthesia   . Foot fracture, left    WEARING BRACE  . GERD (gastroesophageal reflux disease)   . History of hiatal hernia   . Hypertension   . IBS (irritable bowel syndrome)   . PONV (postoperative nausea and vomiting)   . Shortness of breath dyspnea   . Wheezing    OCC    Past Surgical History:  Procedure Laterality Date  . ABDOMINAL HYSTERECTOMY    . CATARACT EXTRACTION W/PHACO Right 02/05/2016   Procedure: CATARACT EXTRACTION PHACO AND INTRAOCULAR LENS PLACEMENT (IOC);  Surgeon: Estill Cotta, MD;  Location: [facility] ORS;  Service: Ophthalmology;  Laterality: Right;  [place] 01:02 AP% 23.9 CDE 25.0 fluid pack lot # [id number]  . colonoscopy with polypectomy    . ESOPHAGOGASTRODUODENOSCOPY    . FOOT SURGERY     X 2  . HAND SURGERY    . NASAL SINUS SURGERY    . TONSILLECTOMY      Prior to Admission medications   Medication Sig Start Date End Date Taking? Authorizing Provider  albuterol (PROVENTIL HFA;VENTOLIN HFA) 108 (90 Base) MCG/ACT inhaler Inhale 2 puffs into the lungs every 6 (six) hours as needed for wheezing or shortness of breath.   Yes [provider]  alendronate (FOSAMAX) 70 MG tablet Take 70 mg by mouth once a week. Take with a full glass of water on an empty stomach.   Yes [provider]  amLODipine (NORVASC) 5 MG tablet Take 2.5 mg by mouth daily.   Yes [provider]  aspirin 81 MG chewable tablet Chew 81 mg by mouth daily.   Yes [provider]  budesonide-formoterol (SYMBICORT) 160-4.5 MCG/ACT inhaler Inhale 2 puffs into the lungs 2 (two) times daily.   Yes [provider]  calcium-vitamin D (OSCAL WITH D) 500-200 MG-UNIT tablet Take 1  tablet by mouth 3 (three) times daily.   Yes [provider]  ibuprofen (ADVIL,MOTRIN) 200 MG tablet Take 200 mg by mouth every 6 (six) hours as needed.   Yes [provider]  loperamide (IMODIUM) 2 MG capsule Take 2 mg by mouth as needed for diarrhea or loose stools.   Yes [provider]  triamterene-hydrochlorothiazide (DYAZIDE) 37.5-25 MG capsule Take 1 capsule by mouth daily.   Yes [provider]  verapamil (CALAN) 120 MG tablet Take 60 mg by mouth 2 (two) times daily.   Yes [provider]  estradiol (ESTRACE) 1 MG tablet Take 1 mg by mouth daily.    [provider]  losartan-hydrochlorothiazide (HYZAAR) 100-12.5 MG tablet Take 1 tablet by mouth daily.    [provider]  montelukast (SINGULAIR) 10 MG tablet Take 10 mg by mouth at bedtime.    [provider]  omeprazole (PRILOSEC) 20 MG capsule Take 20 mg by mouth daily.    [provider]  potassium chloride (K-DUR,KLOR-CON) 10 MEQ tablet Take 5 mEq by mouth daily.    [provider]  torsemide (DEMADEX) 20 MG tablet Take 10 mg by mouth daily.    [provider]    Allergies as of 12/10/2018 - Review Complete 02/05/2016  Allergen Reaction Noted  . Augmentin [amoxicillin-pot clavulanate] Nausea And Vomiting 01/23/2016  .  Eryc [erythromycin]  01/23/2016    Family History  Problem Relation Age of Onset  . Breast cancer Other     Social History   Socioeconomic History  . Marital status: Widowed    Spouse name: Not on file  . Number of children: Not on file  . Years of education: Not on file  . Highest education level: Not on file  Occupational History  . Not on file  Social Needs  . Financial resource strain: Not on file  . Food insecurity:    Worry: Not on file    Inability: Not on file  . Transportation needs:    Medical: Not on file    Non-medical: Not on file  Tobacco Use  . Smoking status: Never Smoker  . Smokeless  tobacco: Never Used  Substance and Sexual Activity  . Alcohol use: No  . Drug use: Not on file  . Sexual activity: Not on file  Lifestyle  . Physical activity:    Days per week: Not on file    Minutes per session: Not on file  . Stress: Not on file  Relationships  . Social connections:    Talks on phone: Not on file    Gets together: Not on file    Attends religious service: Not on file    Active member of club or organization: Not on file    Attends meetings of clubs or organizations: Not on file    Relationship status: Not on file  . Intimate partner violence:    Fear of current or ex partner: Not on file    Emotionally abused: Not on file    Physically abused: Not on file    Forced sexual activity: Not on file  Other Topics Concern  . Not on file  Social History Narrative  . Not on file    Review of Systems: See HPI, otherwise negative ROS  Physical Exam: BP 137/84   Pulse 76   Temp 98.2 F (36.8 C) (Oral)   Resp 18   Ht 5' (1.524 m)   Wt 57.6 kg   SpO2 99%   BMI 24.80 kg/m  General:   Alert,  pleasant and cooperative in NAD Head:  Normocephalic and atraumatic. Neck:  Supple; no masses or thyromegaly. Lungs:  Clear throughout to auscultation.    Heart:  Regular rate and rhythm. Abdomen:  Soft, nontender and nondistended. Normal bowel sounds, without guarding, and without rebound.   Neurologic:  Alert and  oriented x4;  grossly normal neurologically.  Impression/Plan: [name] is here for an colonoscopy to be performed for [facility] colon polyp.  Risks, benefits, limitations, and alternatives regarding  colonoscopy have been reviewed with the patient.  Questions have been answered.  All parties agreeable.   Gaylyn Cheers, MD  02/03/2019, 8:48 AM

## 2019-02-03 NOTE — Anesthesia Postprocedure Evaluation (Signed)
Anesthesia Post Note  Patient: Tracy Harvey  Procedure(s) Performed: COLONOSCOPY WITH PROPOFOL (N/A )  Patient location during evaluation: Endoscopy Anesthesia Type: General Level of consciousness: awake and alert Pain management: pain level controlled Vital Signs Assessment: post-procedure vital signs reviewed and stable Respiratory status: spontaneous breathing, nonlabored ventilation, respiratory function stable and patient connected to nasal cannula oxygen Cardiovascular status: blood pressure returned to baseline and stable Postop Assessment: no apparent nausea or vomiting Anesthetic complications: no     Last Vitals:  Vitals:   02/03/19 0930 02/03/19 0940  BP: (!) 159/81 (!) 138/119  Pulse: 65 62  Resp: 18 20  Temp:    SpO2: 99% 99%    Last Pain:  Vitals:   02/03/19 0919  TempSrc: Oral  PainSc:                  Kyrstin Campillo S

## 2019-02-03 NOTE — Anesthesia Preprocedure Evaluation (Signed)
Anesthesia Evaluation  Patient identified by MRN, date of birth, ID band Patient awake    Reviewed: Allergy & Precautions, NPO status , Patient's Chart, lab work & pertinent test results, reviewed documented beta blocker date and time   History of Anesthesia Complications (+) PONV and history of anesthetic complications  Airway Mallampati: II  TM Distance: >3 FB     Dental  (+) Chipped   Pulmonary shortness of breath, asthma ,           Cardiovascular hypertension, Pt. on medications      Neuro/Psych    GI/Hepatic hiatal hernia, GERD  ,  Endo/Other    Renal/GU      Musculoskeletal  (+) Arthritis ,   Abdominal   Peds  Hematology   Anesthesia Other Findings Hx of PACs.  Reproductive/Obstetrics                             Anesthesia Physical Anesthesia Plan  ASA: II  Anesthesia Plan: General   Post-op Pain Management:    Induction: Intravenous  PONV Risk Score and Plan:   Airway Management Planned:   Additional Equipment:   Intra-op Plan:   Post-operative Plan:   Informed Consent: I have reviewed the patients History and Physical, chart, labs and discussed the procedure including the risks, benefits and alternatives for the proposed anesthesia with the patient or authorized representative who has indicated his/her understanding and acceptance.       Plan Discussed with: CRNA  Anesthesia Plan Comments:         Anesthesia Quick Evaluation

## 2019-02-03 NOTE — Transfer of Care (Signed)
Immediate Anesthesia Transfer of Care Note  Patient: Tracy Harvey  Procedure(s) Performed: COLONOSCOPY WITH PROPOFOL (N/A )  Patient Location: PACU and Endoscopy Unit  Anesthesia Type:General  Level of Consciousness: awake, alert  and oriented  Airway & Oxygen Therapy: Patient Spontanous Breathing  Post-op Assessment: Report given to RN and Post -op Vital signs reviewed and stable  Post vital signs: Reviewed and stable  Last Vitals:  Vitals Value Taken Time  BP 121/68 02/03/2019  9:19 AM  Temp 36.3 C 02/03/2019  9:19 AM  Pulse 71 02/03/2019  9:20 AM  Resp 16 02/03/2019  9:20 AM  SpO2 100 % 02/03/2019  9:20 AM  Vitals shown include unvalidated device data.  Last Pain:  Vitals:   02/03/19 0919  TempSrc: Oral  PainSc:          Complications: No apparent anesthesia complications

## 2019-04-21 ENCOUNTER — Other Ambulatory Visit: Payer: Self-pay | Admitting: Internal Medicine

## 2019-04-21 DIAGNOSIS — Z1231 Encounter for screening mammogram for malignant neoplasm of breast: Secondary | ICD-10-CM

## 2019-05-18 IMAGING — MG MM DIGITAL SCREENING BILAT W/ TOMO W/ CAD
8 series · 8 of 24 positions shown · non-contrast
Comparison: Previous exam(s).

CLINICAL DATA: Screening.

EXAM:
DIGITAL SCREENING BILATERAL MAMMOGRAM WITH TOMO AND CAD

[L CC synth-2D]
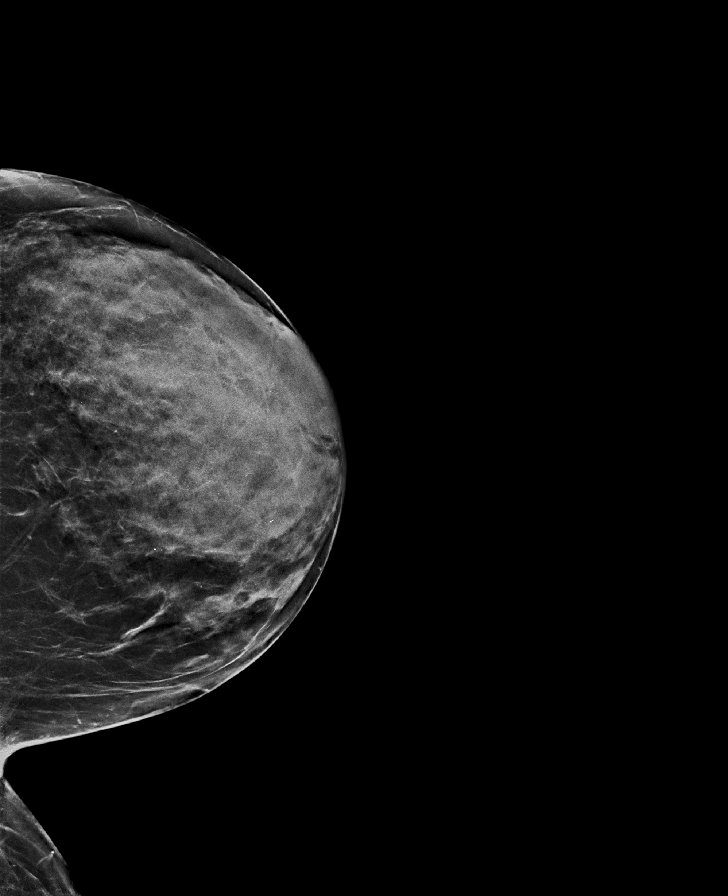

[L MLO synth-2D]
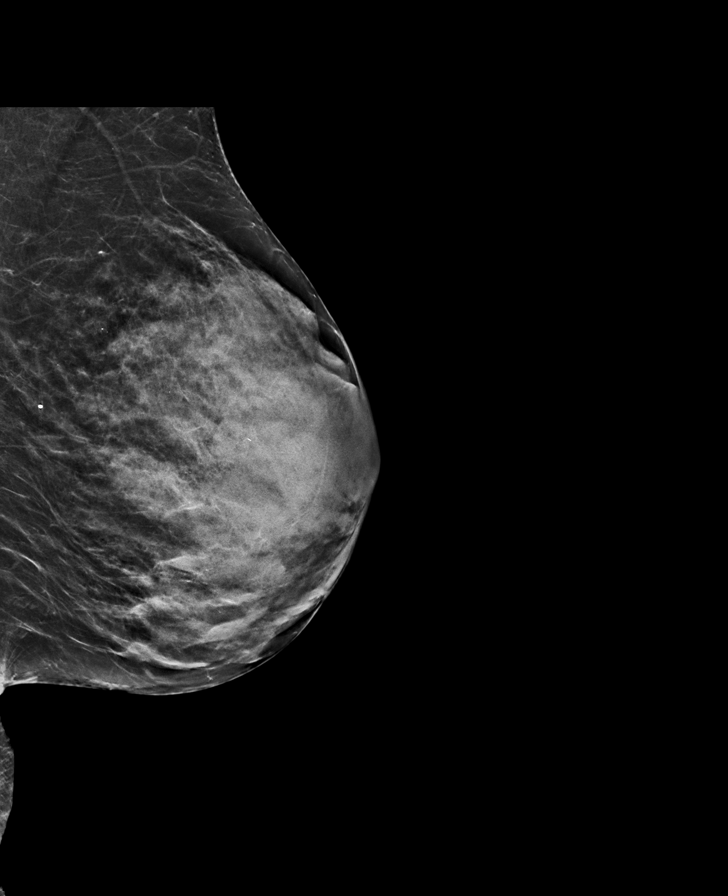

[R MLO synth-2D]
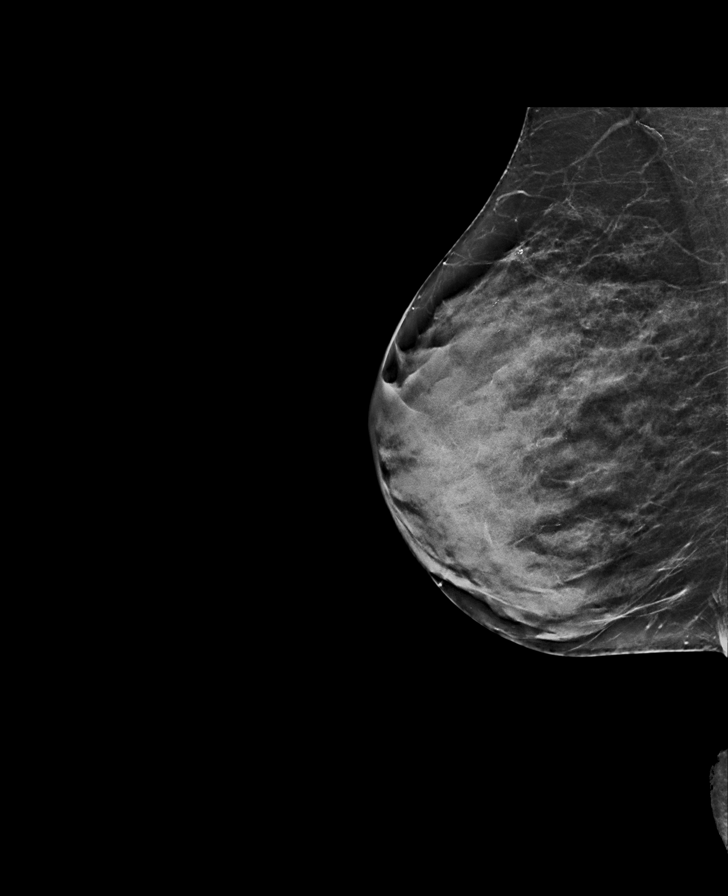

[R CC synth-2D]
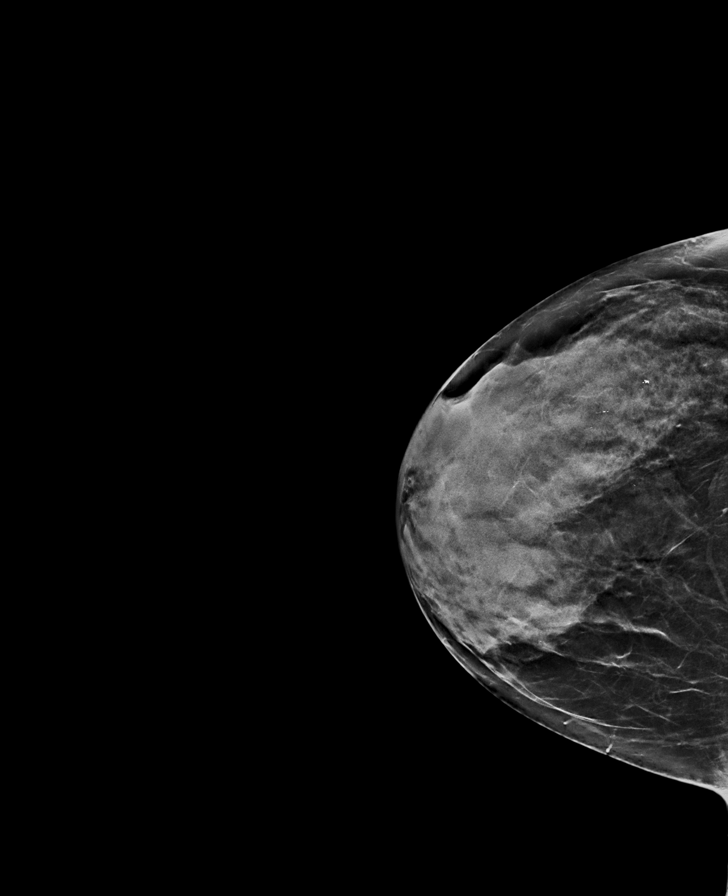

[L CC tomo · tomo slice 31/62.0]
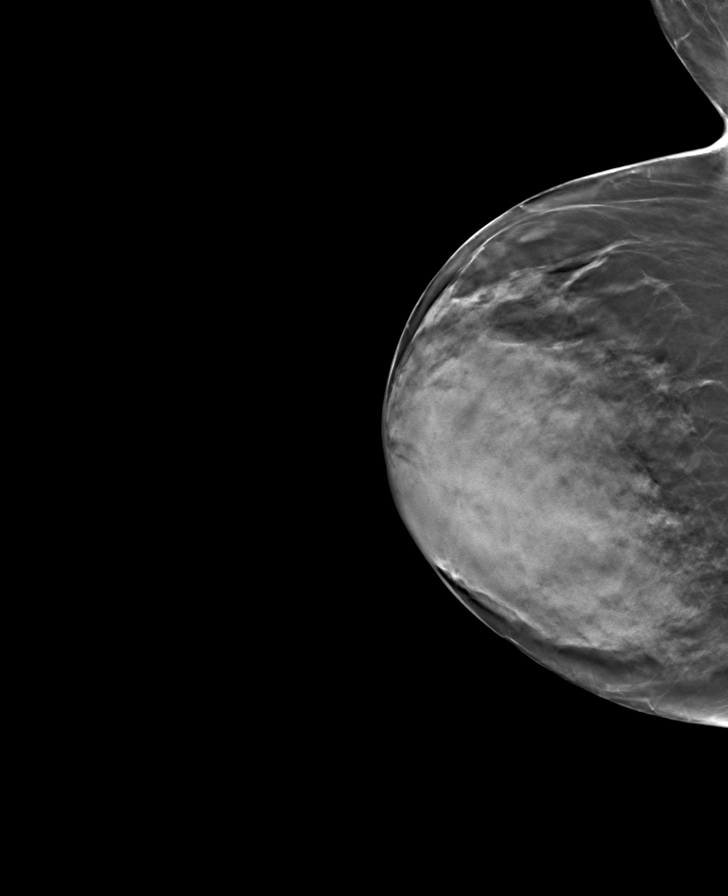

[L MLO tomo · tomo slice 31/60.0]
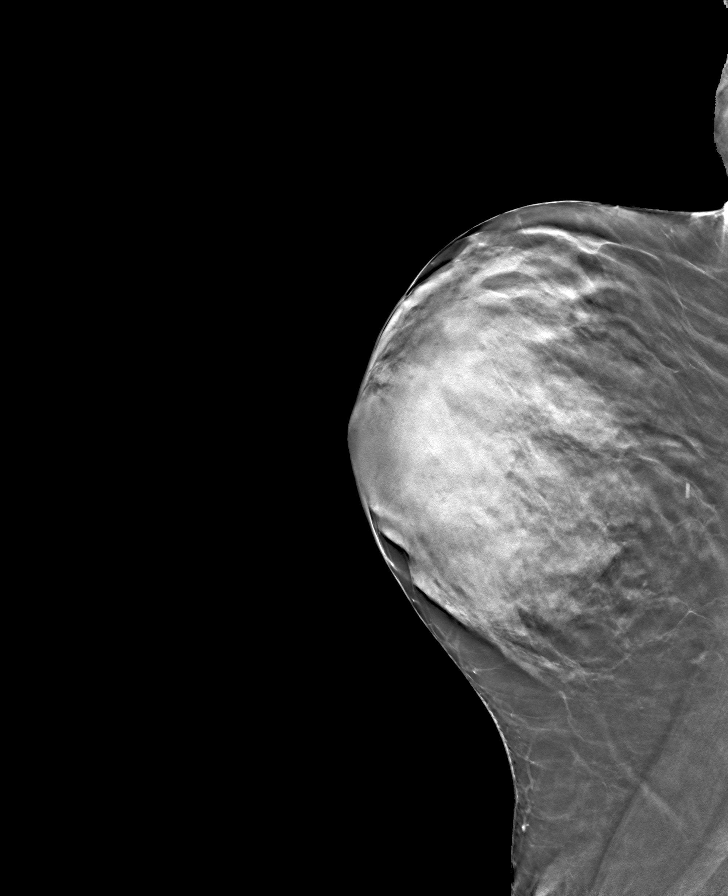

[R CC tomo · tomo slice 31/61.0]
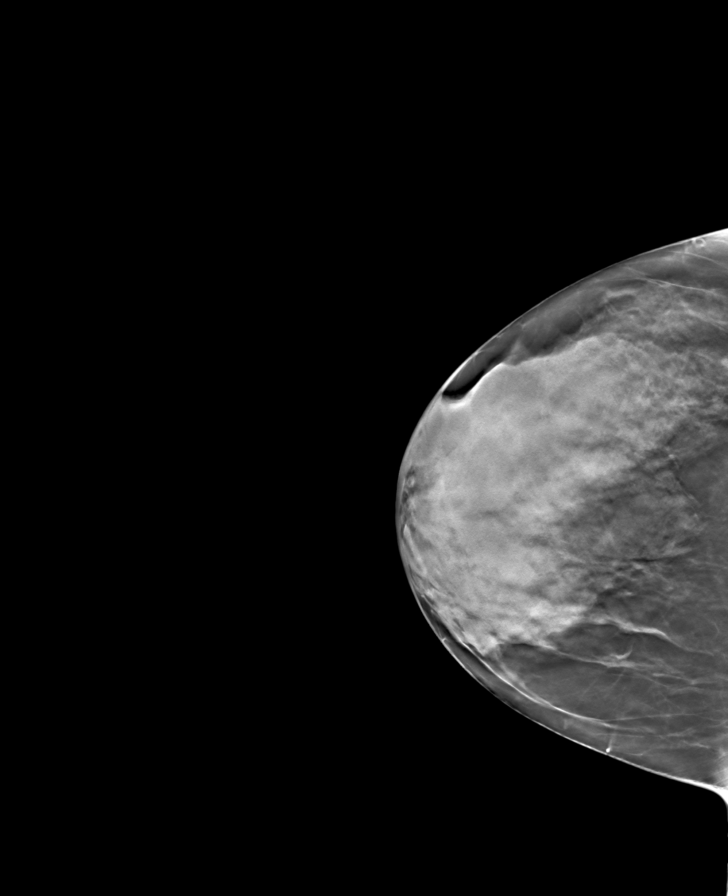

[R MLO tomo · tomo slice 30/59.0]
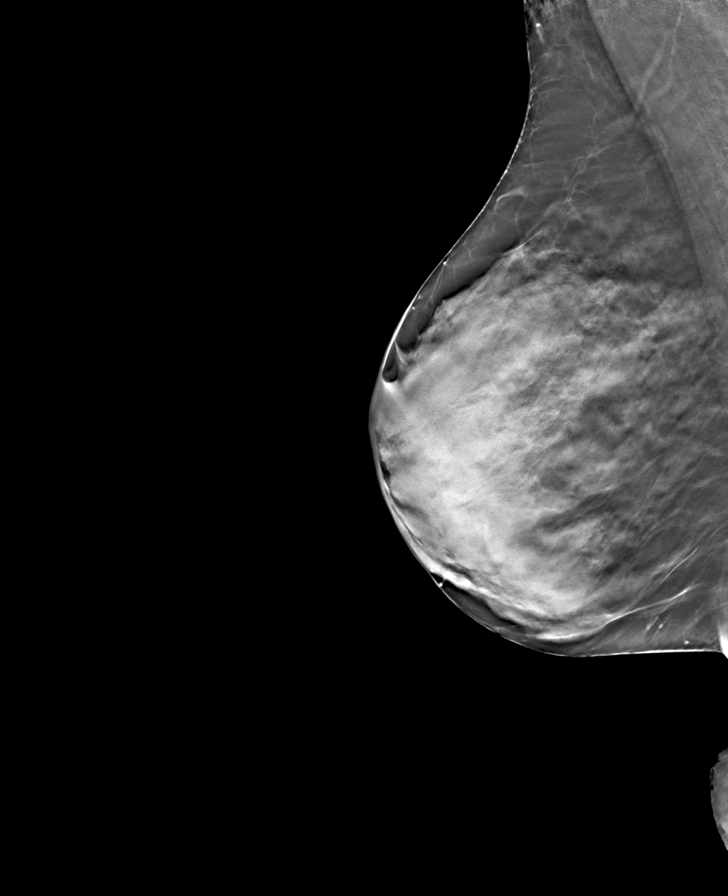

[8 of 24 positions shown; findings below may reference images not displayed]

ACR Breast Density Category d: The breast tissue is extremely dense,
which lowers the sensitivity of mammography. : The breast tissue is
extremely dense, which lowers the sensitivity of mammography.
FINDINGS: There are no findings suspicious for malignancy. Images were
processed with CAD.
IMPRESSION: No mammographic evidence of malignancy. A result letter of this
screening mammogram will be mailed directly to the patient.

RECOMMENDATION:
Screening mammogram in one year. (Code:HT-3-NVW)

BI-RADS CATEGORY  1: Negative.

## 2019-06-09 ENCOUNTER — Other Ambulatory Visit: Payer: Self-pay

## 2019-06-09 ENCOUNTER — Ambulatory Visit
Admission: RE | Admit: 2019-06-09 | Discharge: 2019-06-09 | Disposition: A | Discharge status: Home / Self Care | Payer: Medicare Other | Source: Ambulatory Visit | Attending: Internal Medicine | Admitting: Internal Medicine

## 2019-06-09 DIAGNOSIS — Z1231 Encounter for screening mammogram for malignant neoplasm of breast: Secondary | ICD-10-CM | POA: Insufficient documentation

## 2019-06-15 ENCOUNTER — Other Ambulatory Visit: Payer: Self-pay | Admitting: Internal Medicine

## 2019-06-15 DIAGNOSIS — N6320 Unspecified lump in the left breast, unspecified quadrant: Secondary | ICD-10-CM

## 2019-06-15 DIAGNOSIS — R928 Other abnormal and inconclusive findings on diagnostic imaging of breast: Secondary | ICD-10-CM

## 2019-06-22 ENCOUNTER — Ambulatory Visit
Admission: RE | Admit: 2019-06-22 | Discharge: 2019-06-22 | Disposition: A | Discharge status: Home / Self Care | Payer: Medicare Other | Source: Ambulatory Visit | Attending: Internal Medicine | Admitting: Internal Medicine

## 2019-06-22 DIAGNOSIS — R928 Other abnormal and inconclusive findings on diagnostic imaging of breast: Secondary | ICD-10-CM | POA: Insufficient documentation

## 2019-06-22 DIAGNOSIS — N6320 Unspecified lump in the left breast, unspecified quadrant: Secondary | ICD-10-CM

## 2019-11-24 ENCOUNTER — Encounter
Admission: RE | Admit: 2019-11-24 | Discharge: 2019-11-24 | Disposition: A | Discharge status: Home / Self Care | Payer: Medicare Other | Source: Ambulatory Visit | Attending: Orthopedic Surgery | Admitting: Orthopedic Surgery

## 2019-11-24 DIAGNOSIS — Z01818 Encounter for other preprocedural examination: Secondary | ICD-10-CM | POA: Insufficient documentation

## 2019-11-24 DIAGNOSIS — Z20822 Contact with and (suspected) exposure to covid-19: Secondary | ICD-10-CM | POA: Diagnosis not present

## 2019-11-24 HISTORY — DX: Headache, unspecified: R51.9

## 2019-11-24 HISTORY — DX: Malignant (primary) neoplasm, unspecified: C80.1

## 2019-11-24 NOTE — H&P (Signed)
ORTHOPAEDIC HISTORY & PHYSICAL Progress Notes by Gwenlyn Fudge, PA at 11/22/2019 2:45 PM   [facility] Chief Complaint:       Chief Complaint  Patient presents with  . Elbow Injury    H & P LEFT ELBOW    History of Present Illness:    Tracy Harvey is a 74 y.o. female that presents to clinic today for initial evaluation and management of displaced left olecranon fracture.  She sustained the injury 3 days ago on 11/19/19 when she slipped and fell on a wooden floor, landing on her left elbow. The patient was evaluated in the walk-in clinic, placed in a posterior splint after radiographs identified a fracture, and told to follow up with orthopaedics on an outpatient basis.  Patient presents today with a friend.  The pain is located over the left elbow.  Patient states she has been wearing the splint as directed, but had to make adjustments as it was digging into her arm.  Patient denies any significant cardiac history or history of blood clots.    Past Medical, Surgical, Family, Social History, Allergies, Medications:  Past Medical History:      Past Medical History:  Diagnosis Date  . Allergic rhinitis   . Asthma without status asthmaticus, unspecified   . Cataract cortical, senile   . Congenital foot deformity   . Gastritis   . GERD (gastroesophageal reflux disease)   . Hyperlipidemia   . Hypertension   . IBS (irritable bowel syndrome)   . Menopause   . Migraine headache   . Osteoporosis, post-menopausal     Past Surgical History:       Past Surgical History:  Procedure Laterality Date  . COLONOSCOPY  12/03/2013   Adenomatous Polyps: [facility] 11/2018 Recall ltr mailed   . COLONOSCOPY  02/03/2019   PH Adenomatous Polyp: [facility] 01/2024  . EGD  12/03/2013   Dilated  . sinus surgery with turbinate reduction  12/04/10  . TONSILLECTOMY AND ADENOIDECTOMY    . total abdominal hysterectomy for  fibroids      Current Medications:  Current Medications        Current Outpatient Medications  Medication Sig Dispense Refill  . alendronate (FOSAMAX) 70 MG tablet TAKE ONE TABLET BY MOUTH EVERY 7 DAYS. TAKE WITH A FULL GLASS OF WATER. DO NOT LIE DOWN FOR THE NEXT 30 MINUTES. 4 tablet 11  . amLODIPine (NORVASC) 5 MG tablet Take 0.5 tablets (2.5 mg total) by mouth once daily 45 tablet 1  . aspirin 81 MG EC tablet Take 81 mg by mouth once daily.    . biotin 10,000 mcg Cap Take by mouth    . calcium carbonate-vitamin D3 (CALTRATE 600+D) 600 mg(1,500mg ) -200 unit tablet Take 1 tablet by mouth 3 (three) times daily with meals.    . cholecalciferol (VITAMIN D3) 2,000 unit capsule Take 2,000 Units by mouth once daily.    . duke's magic mouthwash suspension Swish and swallow 5 mLs 4 (four) times daily 240 mL 1  . estradioL (ESTRACE) 1 MG tablet TAKE 1 TABLET BY MOUTH ONCE DAILY. 28 tablet 11  . etodolac (LODINE) 400 MG tablet Take 1 tablet (400 mg total) by mouth 2 (two) times daily 60 tablet 11  . fluticasone propionate (FLONASE) 50 mcg/actuation nasal spray USE 2 SPRAYS IN EACH NOSTRIL ONCE DAILY. 16 g 0  . L. acidophilus-L. rhamnosus (PROBIOTIC) 15 billion cell Cap Take by mouth    . loperamide (  IMODIUM A-D) 2 mg tablet Take 2 mg by mouth as needed for Diarrhea    . losartan-hydrochlorothiazide (HYZAAR) 100-12.5 mg tablet TAKE 1 TABLET BY MOUTH ONCE DAILY. 28 tablet 11  . magnesium 250 mg Tab Take by mouth    . montelukast (SINGULAIR) 10 mg tablet Take 1 tablet (10 mg total) by mouth nightly 90 tablet 1  . omeprazole (PRILOSEC) 20 MG DR capsule Take 1 capsule (20 mg total) by mouth once daily 90 capsule 1  . ondansetron (ZOFRAN) 4 MG tablet Take 1 tablet the night before surgery and one the morning of surgery. 2 tablet 0  . potassium chloride (KLOR-CON) 10 MEQ ER tablet TAKE 1 TABLET BY MOUTH ONCE DAILY. 28 tablet 10  . PROAIR HFA 90 mcg/actuation inhaler INHALE 2 PUFFS BY  MOUTH EVERY 4 HOURS AS NEEDED FOR WHEEZING 8.5 g 10  . SYMBICORT 160-4.5 mcg/actuation inhaler INHALE 2 PUFFS BY MOUTH TWICE DAILY. 10.2 g 10  . triamterene-hydrochlorothiazide (DYAZIDE) 37.5-25 mg capsule TAKE (1) CAPSULE BY MOUTH ONCE DAILY. 90 capsule 1  . TURMERIC ORAL Take by mouth Curcumin 500mg  one daily    . verapamiL (CALAN) 120 MG tablet Take 0.5 tablets (60 mg total) by mouth 2 (two) times daily 90 tablet 1   No current facility-administered medications for this visit.       Allergies:       Allergies  Allergen Reactions  . Chocolate Flavor Other (See Comments)    migraines  . Lactase Other (See Comments)    migraines  . Morphine Vomiting  . Augmentin [Amoxicillin-Pot Clavulanate] Other (See Comments)    GI intolerance  . Biaxin [Clarithromycin] Unknown  . Egg Headache    Social History:  Social History  Social History        Socioeconomic History  . Marital status: Married    Spouse name: Not on file  . Number of children: Not on file  . Years of education: Not on file  . Highest education level: Not on file  Occupational History  . Not on file  Social Needs  . Financial resource strain: Not on file  . Food insecurity    Worry: Not on file    Inability: Not on file  . Transportation needs    Medical: Not on file    Non-medical: Not on file  Tobacco Use  . Smoking status: Never Smoker  . Smokeless tobacco: Never Used  . Tobacco comment: She is widowed and has one daughter.  Substance and Sexual Activity  . Alcohol use: No  . Drug use: No  . Sexual activity: Not on file  Lifestyle  . Physical activity    Days per week: Not on file    Minutes per session: Not on file  . Stress: Not on file  Relationships  . Social Herbalist on phone: Not on file    Gets together: Not on file    Attends religious service: Not on file    Active member of club or organization: Not on file    Attends meetings of  clubs or organizations: Not on file    Relationship status: Not on file  Other Topics Concern  . Not on file  Social History Narrative  . Not on file      Family History:       Family History  Problem Relation Age of Onset  . Coronary Artery Disease (Blocked arteries around heart) Father   . High blood pressure (  Hypertension) Father   . Diabetes type II Father   . Myocardial Infarction (Heart attack) Father   . Dementia Mother   . Coronary Artery Disease (Blocked arteries around heart) Mother   . Osteoporosis (Thinning of bones) Mother   . Skin cancer Paternal Uncle   . High blood pressure (Hypertension) Sister     Review of Systems:   A 10+ ROS was performed, reviewed, and the pertinent orthopaedic findings are documented in the HPI.    Physical Examination:   BP 140/80   Ht 152.4 cm (5')   Wt 64.3 kg (141 lb 12.8 oz)   LMP  (LMP Unknown)   BMI 27.69 kg/m   Patient is a well-developed, well-nourished female in no acute distress. Patient has normal mood and affect. Patient is alert and oriented to person, place, and time. Pupils are equal and round with synchronous movement. No injected sclera. There is no palpable lymphadenopathy. Respirations are normal, without noticeable retractions.   Cardiovascular: Regular rate and rhythm, with no murmurs, rubs, or gallops.  Distal pulses palpable.  Respiratory: Lungs clear to auscultation bilaterally.   Patient is able to actively flex and abduct the left shoulder.  Patient presents in a posterior elbow splint.  Following splint removal, it was noted that skin over the left elbow is clean and dry.  Ecchymosis noted over the left elbow and forearm.  Moderate edema noted over the left elbow.  Skin is intact, but the beginning of skin breakdown is noted just proximal to the ulnar styloid.  Range of motion examination of the elbow deferred due to injury.   Patient is able to actively flex and extend the left  wrist.   Patient is able to make the thumbs up sign, okay sign, and criss-cross the 2nd and 3rd digits. Sensation is intact over the median, radial, and ulnar nerve distributions. Radial pulse 2+   Tests Performed/Reviewed:  X-rays  No new radiographs were obtained today, but 2 views of the left elbow obtained on 11/19/2019 were reviewed.  Images reveal a grossly displaced left olecranon fracture.  No other fractures or dislocations noted.  I personally reviewed and visualized the imaging studies if available. I additionally personally interpreted any radiographs taken during today's visit.  Impression:     ICD-10-CM  1. Closed olecranon fracture, left, initial encounter  S52.022A    SECONDARY CONDITIONS THAT INFLUENCE TREATMENT AND DECISION-MAKING:   Plan:   -Displaced left olecranon fracture The significance of patient's injury was explained.  Surgical treatment is indicated at this time.  Dr. Marry Guan spoke with the patient and explained that there are 2 methods with which he may proceed during surgery.  The typical postoperative course was explained to the patient.  The risks and benefits of surgery were explained to the patient.  Patient's questions were answered.  Patient elects to proceed with surgery.  Surgery will be tentatively scheduled for this Friday, 11/26/2019.  Patient is instructed to apply ice packs to the arm as much as possible to decrease swelling prior to surgery.  Patient understands.  Patient is to follow-up 2 weeks postoperatively.  Contact our office with any questions or concerns.  Follow up as indicated, or sooner should any new problems arise, if conditions worsen, or if they are otherwise concerned.   Gwenlyn Fudge, PA [facility] [place], [place] [address] Phone: [phone]      Electronically signed by Gwenlyn Fudge, [place] on 11/22/2019 8:34 PM

## 2019-11-24 NOTE — Patient Instructions (Addendum)
Your procedure is scheduled on: 11-26-19 FRIDAY Report to Same Day Surgery 2nd floor medical mall [facility] Entrance-take elevator on left to 2nd floor.  Check in with surgery information desk.) To find out your arrival time please call [phone] between 1PM - 3PM on 11-25-19 THURSDAY  Remember: Instructions that are not followed completely may result in serious medical risk, up to and including death, or upon the discretion of your surgeon and anesthesiologist your surgery may need to be rescheduled.    _x___ 1. Do not eat food after midnight the night before your procedure. NO GUM OR CANDY AFTER MIDNIGHT. You may drink clear liquids up to 2 hours before you are scheduled to arrive at the hospital for your procedure.  Do not drink clear liquids within 2 hours of your scheduled arrival to the hospital.  Clear liquids include  --Water or Apple juice without pulp  --Clear carbohydrate beverage such as ClearFast or Gatorade  --Black Coffee or Clear Tea (No milk, no creamers, do not add anything to the coffee or Tea   ____Ensure clear carbohydrate drink on the way to the hospital for bariatric patients  _X___GATORADE G2 drink 3 hours PRIOR TO ARRIVAL TIME TO HOSPITAL     __x__ 2. No Alcohol for 24 hours before or after surgery.   __x__3. No Smoking or e-cigarettes for 24 prior to surgery.  Do not use any chewable tobacco products for at least 6 hour prior to surgery   ____  4. Bring all medications with you on the day of surgery if instructed.    __x__ 5. Notify your doctor if there is any change in your medical condition     (cold, fever, infections).    x___6. On the morning of surgery brush your teeth with toothpaste and water.  You may rinse your mouth with mouth wash if you wish.  Do not swallow any toothpaste or mouthwash.   Do not wear jewelry, make-up, hairpins, clips or nail polish.  Do not wear lotions, powders, or perfumes. You may wear deodorant.  Do not shave 48 hours  prior to surgery. Men may shave face and neck.  Do not bring valuables to the hospital.    [facility] is not responsible for any belongings or valuables.               Contacts, dentures or bridgework may not be worn into surgery.  Leave your suitcase in the car. After surgery it may be brought to your room.  For patients admitted to the hospital, discharge time is determined by your  treatment team.  _  Patients discharged the day of surgery will not be allowed to drive home.  You will need someone to drive you home and stay with you the night of your procedure.    Please read over the following fact sheets that you were given:   [facility] Preparing for Surgery   _x___ TAKE THE FOLLOWING MEDICATION THE MORNING OF SURGERY WITH A SMALL SIP OF WATER. These include:  1. VERAPAMIL  2. PRILOSEC (OMEPRAZOLE)  3. TAKE AN EXTRA PRILOSEC THE NIGHT BEFORE YOUR SURGERY  4. PATIENT STATES DR HOOTEN HAS ORDERED HER TO TAKE ZOFRAN DAY OF SURGERY   5.  6.  ____Fleets enema or Magnesium Citrate as directed.   _x___ Use sage wipes as directed on instruction sheet   _X___ Use inhalers on the day of surgery and bring to hospital day of surgery-USE YOUR SYMBICORT AND YOUR ALBUTEROL INHALER  DAY OF SURGERY AND BRING ALBUTEROL INHALER TO HOSPITAL  ____ Stop Metformin and Janumet 2 days prior to surgery.    ____ Take 1/2 of usual insulin dose the night before surgery and none on the morning surgery.   _x___ Follow recommendations from Cardiologist, Pulmonologist or PCP regarding stopping Aspirin, Coumadin, Plavix ,Eliquis, Effient, or Pradaxa, and Pletal-PT HAS ALREADY STOPPED ASPIRIN  X____Stop Anti-inflammatories such as Advil, Aleve, Ibuprofen, Motrin, Naproxen, Naprosyn, Goodies powders or aspirin products NOW-OK to take Tylenol   ____ Stop supplements until after surgery.    ____ Bring C-Pap to the hospital.

## 2019-11-25 ENCOUNTER — Other Ambulatory Visit: Payer: Self-pay

## 2019-11-25 ENCOUNTER — Other Ambulatory Visit: Admission: RE | Admit: 2019-11-25 | Payer: Medicare Other | Source: Ambulatory Visit

## 2019-11-25 ENCOUNTER — Encounter
Admission: RE | Admit: 2019-11-25 | Discharge: 2019-11-25 | Disposition: A | Discharge status: Home / Self Care | Payer: Medicare Other | Source: Ambulatory Visit | Attending: Orthopedic Surgery | Admitting: Orthopedic Surgery

## 2019-11-25 DIAGNOSIS — Z01818 Encounter for other preprocedural examination: Secondary | ICD-10-CM | POA: Diagnosis not present

## 2019-11-25 LAB — POTASSIUM: Potassium: 3.3 mmol/L — ABNORMAL LOW (ref 3.5–5.1)

## 2019-11-25 MED ORDER — CLINDAMYCIN PHOSPHATE 900 MG/50ML IV SOLN
900.0000 mg | INTRAVENOUS | Status: AC
  Administered 2019-11-26: 900 mg via INTRAVENOUS

## 2019-11-26 ENCOUNTER — Other Ambulatory Visit: Payer: Self-pay

## 2019-11-26 ENCOUNTER — Encounter
Admission: RE | Disposition: A | Discharge status: Home / Self Care | Payer: Self-pay | Source: Home / Self Care | Attending: Orthopedic Surgery

## 2019-11-26 ENCOUNTER — Ambulatory Visit: Payer: Medicare Other | Admitting: Certified Registered"

## 2019-11-26 ENCOUNTER — Ambulatory Visit
Admission: RE | Admit: 2019-11-26 | Discharge: 2019-11-26 | Disposition: A | Discharge status: Home / Self Care | Payer: Medicare Other | Attending: Orthopedic Surgery | Admitting: Orthopedic Surgery

## 2019-11-26 ENCOUNTER — Encounter: Payer: Self-pay | Admitting: Orthopedic Surgery

## 2019-11-26 DIAGNOSIS — E785 Hyperlipidemia, unspecified: Secondary | ICD-10-CM | POA: Insufficient documentation

## 2019-11-26 DIAGNOSIS — S52022A Displaced fracture of olecranon process without intraarticular extension of left ulna, initial encounter for closed fracture: Secondary | ICD-10-CM | POA: Diagnosis present

## 2019-11-26 DIAGNOSIS — J45909 Unspecified asthma, uncomplicated: Secondary | ICD-10-CM | POA: Insufficient documentation

## 2019-11-26 DIAGNOSIS — Z8262 Family history of osteoporosis: Secondary | ICD-10-CM | POA: Insufficient documentation

## 2019-11-26 DIAGNOSIS — W19XXXA Unspecified fall, initial encounter: Secondary | ICD-10-CM | POA: Insufficient documentation

## 2019-11-26 DIAGNOSIS — K589 Irritable bowel syndrome without diarrhea: Secondary | ICD-10-CM | POA: Insufficient documentation

## 2019-11-26 DIAGNOSIS — I10 Essential (primary) hypertension: Secondary | ICD-10-CM | POA: Insufficient documentation

## 2019-11-26 DIAGNOSIS — Z79899 Other long term (current) drug therapy: Secondary | ICD-10-CM | POA: Insufficient documentation

## 2019-11-26 DIAGNOSIS — Z7951 Long term (current) use of inhaled steroids: Secondary | ICD-10-CM | POA: Diagnosis not present

## 2019-11-26 DIAGNOSIS — Z8249 Family history of ischemic heart disease and other diseases of the circulatory system: Secondary | ICD-10-CM | POA: Insufficient documentation

## 2019-11-26 DIAGNOSIS — M1990 Unspecified osteoarthritis, unspecified site: Secondary | ICD-10-CM | POA: Insufficient documentation

## 2019-11-26 DIAGNOSIS — Z85828 Personal history of other malignant neoplasm of skin: Secondary | ICD-10-CM | POA: Diagnosis not present

## 2019-11-26 DIAGNOSIS — K219 Gastro-esophageal reflux disease without esophagitis: Secondary | ICD-10-CM | POA: Insufficient documentation

## 2019-11-26 DIAGNOSIS — Z7982 Long term (current) use of aspirin: Secondary | ICD-10-CM | POA: Diagnosis not present

## 2019-11-26 HISTORY — PX: ORIF ELBOW FRACTURE: SHX5031

## 2019-11-26 LAB — SARS CORONAVIRUS 2 (TAT 6-24 HRS): SARS Coronavirus 2: NEGATIVE

## 2019-11-26 SURGERY — OPEN REDUCTION INTERNAL FIXATION (ORIF) ELBOW/OLECRANON FRACTURE
Anesthesia: General | Site: Elbow | Laterality: Left

## 2019-11-26 MED ORDER — FENTANYL CITRATE (PF) 100 MCG/2ML IJ SOLN
INTRAMUSCULAR | Status: AC
  Filled 2019-11-26: qty 2

## 2019-11-26 MED ORDER — ONDANSETRON HCL 4 MG/2ML IJ SOLN
INTRAMUSCULAR | Status: DC | PRN
  Administered 2019-11-26: 4 mg via INTRAVENOUS

## 2019-11-26 MED ORDER — ACETAMINOPHEN 10 MG/ML IV SOLN
INTRAVENOUS | Status: DC | PRN
  Administered 2019-11-26: 1000 mg via INTRAVENOUS

## 2019-11-26 MED ORDER — FENTANYL CITRATE (PF) 100 MCG/2ML IJ SOLN
INTRAMUSCULAR | Status: DC | PRN
  Administered 2019-11-26 (×4): 50 ug via INTRAVENOUS

## 2019-11-26 MED ORDER — METOCLOPRAMIDE HCL 5 MG/ML IJ SOLN: 5.0000 mg | Freq: Three times a day (TID) | INTRAMUSCULAR | Status: DC | PRN

## 2019-11-26 MED ORDER — CELECOXIB 200 MG PO CAPS
ORAL_CAPSULE | ORAL | Status: AC
  Administered 2019-11-26: 400 mg via ORAL
  Filled 2019-11-26: qty 2

## 2019-11-26 MED ORDER — BUPIVACAINE HCL (PF) 0.25 % IJ SOLN
INTRAMUSCULAR | Status: DC | PRN
  Administered 2019-11-26: 10 mL

## 2019-11-26 MED ORDER — PROMETHAZINE HCL 25 MG/ML IJ SOLN
6.2500 mg | Freq: Once | INTRAMUSCULAR | Status: AC
  Administered 2019-11-26: 6.25 mg via INTRAVENOUS

## 2019-11-26 MED ORDER — NEOMYCIN-POLYMYXIN B GU 40-200000 IR SOLN
Status: DC | PRN
  Administered 2019-11-26: 2 mL

## 2019-11-26 MED ORDER — FENTANYL CITRATE (PF) 100 MCG/2ML IJ SOLN: 25.0000 ug | INTRAMUSCULAR | Status: DC | PRN

## 2019-11-26 MED ORDER — CLINDAMYCIN PHOSPHATE 900 MG/50ML IV SOLN
INTRAVENOUS | Status: AC
  Filled 2019-11-26: qty 50

## 2019-11-26 MED ORDER — OXYCODONE HCL 5 MG PO TABS
10.0000 mg | ORAL_TABLET | ORAL | Status: DC | PRN
  Filled 2019-11-26: qty 3

## 2019-11-26 MED ORDER — PROPOFOL 10 MG/ML IV BOLUS
INTRAVENOUS | Status: AC
  Filled 2019-11-26: qty 20

## 2019-11-26 MED ORDER — CHLORHEXIDINE GLUCONATE 4 % EX LIQD: 60.0000 mL | Freq: Once | CUTANEOUS | Status: DC

## 2019-11-26 MED ORDER — BUPIVACAINE HCL (PF) 0.25 % IJ SOLN
INTRAMUSCULAR | Status: AC
  Filled 2019-11-26: qty 30

## 2019-11-26 MED ORDER — NEOMYCIN-POLYMYXIN B GU 40-200000 IR SOLN
Status: AC
  Filled 2019-11-26: qty 20

## 2019-11-26 MED ORDER — HYDROMORPHONE HCL 1 MG/ML IJ SOLN: 0.5000 mg | INTRAMUSCULAR | Status: DC | PRN

## 2019-11-26 MED ORDER — ACETAMINOPHEN 10 MG/ML IV SOLN
INTRAVENOUS | Status: AC
  Filled 2019-11-26: qty 100

## 2019-11-26 MED ORDER — LIDOCAINE HCL (CARDIAC) PF 100 MG/5ML IV SOSY
PREFILLED_SYRINGE | INTRAVENOUS | Status: DC | PRN
  Administered 2019-11-26: 80 mg via INTRAVENOUS

## 2019-11-26 MED ORDER — LACTATED RINGERS IV SOLN: INTRAVENOUS | Status: DC

## 2019-11-26 MED ORDER — FENTANYL CITRATE (PF) 100 MCG/2ML IJ SOLN
INTRAMUSCULAR | Status: AC
  Administered 2019-11-26: 25 ug via INTRAVENOUS
  Filled 2019-11-26: qty 2

## 2019-11-26 MED ORDER — SCOPOLAMINE 1 MG/3DAYS TD PT72: 1.0000 | MEDICATED_PATCH | TRANSDERMAL | Status: DC

## 2019-11-26 MED ORDER — ONDANSETRON HCL 4 MG PO TABS: 4.0000 mg | ORAL_TABLET | Freq: Four times a day (QID) | ORAL | Status: DC | PRN

## 2019-11-26 MED ORDER — SEVOFLURANE IN SOLN
RESPIRATORY_TRACT | Status: AC
  Filled 2019-11-26: qty 250

## 2019-11-26 MED ORDER — ACETAMINOPHEN 325 MG PO TABS: 325.0000 mg | ORAL_TABLET | Freq: Four times a day (QID) | ORAL | Status: DC | PRN

## 2019-11-26 MED ORDER — ONDANSETRON HCL 4 MG/2ML IJ SOLN: 4.0000 mg | Freq: Once | INTRAMUSCULAR | Status: DC | PRN

## 2019-11-26 MED ORDER — SODIUM CHLORIDE FLUSH 0.9 % IV SOLN
INTRAVENOUS | Status: AC
  Filled 2019-11-26: qty 10

## 2019-11-26 MED ORDER — ONDANSETRON HCL 4 MG/2ML IJ SOLN: 4.0000 mg | Freq: Four times a day (QID) | INTRAMUSCULAR | Status: DC | PRN

## 2019-11-26 MED ORDER — METOCLOPRAMIDE HCL 10 MG PO TABS: 5.0000 mg | ORAL_TABLET | Freq: Three times a day (TID) | ORAL | Status: DC | PRN

## 2019-11-26 MED ORDER — PROPOFOL 10 MG/ML IV BOLUS
INTRAVENOUS | Status: DC | PRN
  Administered 2019-11-26: 80 mg via INTRAVENOUS

## 2019-11-26 MED ORDER — PROMETHAZINE HCL 25 MG/ML IJ SOLN
INTRAMUSCULAR | Status: AC
  Filled 2019-11-26: qty 1

## 2019-11-26 MED ORDER — CELECOXIB 200 MG PO CAPS: 400.0000 mg | ORAL_CAPSULE | Freq: Once | ORAL | Status: AC

## 2019-11-26 MED ORDER — OXYCODONE HCL 5 MG PO TABS
5.0000 mg | ORAL_TABLET | ORAL | Status: DC | PRN
  Filled 2019-11-26: qty 2

## 2019-11-26 MED ORDER — SUCCINYLCHOLINE CHLORIDE 20 MG/ML IJ SOLN
INTRAMUSCULAR | Status: DC | PRN
  Administered 2019-11-26: 80 mg via INTRAVENOUS

## 2019-11-26 MED ORDER — SCOPOLAMINE 1 MG/3DAYS TD PT72
MEDICATED_PATCH | TRANSDERMAL | Status: AC
  Administered 2019-11-26: 1.5 mg via TRANSDERMAL
  Filled 2019-11-26: qty 1

## 2019-11-26 MED ORDER — ONDANSETRON HCL 4 MG/2ML IJ SOLN
INTRAMUSCULAR | Status: AC
  Filled 2019-11-26: qty 2

## 2019-11-26 SURGICAL SUPPLY — 46 items
BNDG COHESIVE 4X5 TAN STRL (GAUZE/BANDAGES/DRESSINGS) ×3 IMPLANT
BNDG ELASTIC 4X5.8 VLCR NS LF (GAUZE/BANDAGES/DRESSINGS) ×6 IMPLANT
BNDG ESMARK 4X12 TAN STRL LF (GAUZE/BANDAGES/DRESSINGS) ×5 IMPLANT
CANISTER SUCT 1200ML W/VALVE (MISCELLANEOUS) ×3 IMPLANT
CLOSURE WOUND 1/2 X4 (GAUZE/BANDAGES/DRESSINGS) ×1
COOLER POLAR GLACIER W/PUMP (MISCELLANEOUS) ×3 IMPLANT
COVER WAND RF STERILE (DRAPES) ×3 IMPLANT
CUFF TOURN SGL QUICK 18X4 (TOURNIQUET CUFF) ×3 IMPLANT
DRAPE 3/4 80X56 (DRAPES) ×3 IMPLANT
DRAPE FLUOR MINI C-ARM 54X84 (DRAPES) ×3 IMPLANT
DRAPE POUCH INSTRU U-SHP 10X18 (DRAPES) ×3 IMPLANT
DRAPE SPLIT 6X30 W/TAPE (DRAPES) ×3 IMPLANT
DRSG DERMACEA 8X12 NADH (GAUZE/BANDAGES/DRESSINGS) ×3 IMPLANT
DURAPREP 26ML APPLICATOR (WOUND CARE) ×3 IMPLANT
ELECT CAUTERY BLADE 6.4 (BLADE) ×3 IMPLANT
ELECT REM PT RETURN 9FT ADLT (ELECTROSURGICAL) ×3
ELECTRODE REM PT RTRN 9FT ADLT (ELECTROSURGICAL) ×1 IMPLANT
GAUZE SPONGE 4X4 12PLY STRL (GAUZE/BANDAGES/DRESSINGS) ×3 IMPLANT
GLOVE BIOGEL M STRL SZ7.5 (GLOVE) ×5 IMPLANT
GLOVE INDICATOR 8.0 STRL GRN (GLOVE) ×3 IMPLANT
GOWN STRL REUS W/ TWL LRG LVL3 (GOWN DISPOSABLE) ×2 IMPLANT
GOWN STRL REUS W/TWL LRG LVL3 (GOWN DISPOSABLE) ×4
K-WIRE PROS 1.0X150 (Wire) ×3 IMPLANT
KIT TURNOVER KIT A (KITS) ×3 IMPLANT
KWIRE PROS 1.0X150 (Wire) IMPLANT
LOOP RED MAXI  1X406MM (MISCELLANEOUS) ×2
LOOP VESSEL MAXI 1X406 RED (MISCELLANEOUS) ×1 IMPLANT
NS IRRIG 500ML POUR BTL (IV SOLUTION) ×3 IMPLANT
PACK EXTREMITY ARMC (MISCELLANEOUS) ×3 IMPLANT
PAD ABD DERMACEA PRESS 5X9 (GAUZE/BANDAGES/DRESSINGS) ×6 IMPLANT
PAD CAST CTTN 4X4 STRL (SOFTGOODS) ×3 IMPLANT
PAD PREP 24X41 OB/GYN DISP (PERSONAL CARE ITEMS) ×3 IMPLANT
PAD WRAPON POLAR KNEE (MISCELLANEOUS) ×1 IMPLANT
PADDING CAST COTTON 4X4 STRL (SOFTGOODS) ×6
SPLINT CAST 1 STEP 4X30 (MISCELLANEOUS) ×3 IMPLANT
SPLINT CAST 1 STEP 5X30 WHT (MISCELLANEOUS) ×3 IMPLANT
SPONGE LAP 18X18 RF (DISPOSABLE) ×3 IMPLANT
STAPLER SKIN PROX 35W (STAPLE) ×3 IMPLANT
STOCKINETTE IMPERVIOUS 9X36 MD (GAUZE/BANDAGES/DRESSINGS) ×3 IMPLANT
STRIP CLOSURE SKIN 1/2X4 (GAUZE/BANDAGES/DRESSINGS) ×2 IMPLANT
SUT ETHILON 3-0 FS-10 30 BLK (SUTURE) ×3
SUT STEEL 7 (SUTURE) ×3 IMPLANT
SUT VIC AB 0 CT1 36 (SUTURE) ×2 IMPLANT
SUT VIC AB 2-0 CT2 27 (SUTURE) ×3 IMPLANT
SUTURE EHLN 3-0 FS-10 30 BLK (SUTURE) ×1 IMPLANT
WRAPON POLAR PAD KNEE (MISCELLANEOUS) ×3

## 2019-11-26 NOTE — Op Note (Signed)
OPERATIVE NOTE  DATE OF SURGERY:  11/26/2019  PATIENT NAME:  Tracy Harvey   DOB: 21-Sep-1946  MRN: [id number]  PRE-OPERATIVE DIAGNOSIS: Fracture of the left olecranon  POST-OPERATIVE DIAGNOSIS:  Same  PROCEDURE:  Open reduction and internal fixation of the left olecranon  SURGEON:  Marciano Sequin. M.D.  ASSISTANT:  None  ANESTHESIA: general  ESTIMATED BLOOD LOSS: Minimal  FLUIDS REPLACED: 700 mL of crystalloid  TOURNIQUET TIME: 83 minutes  DRAINS: None  IMPLANTS UTILIZED: 2 - 1.0 mm K wires, 1.25 mm wire  INDICATIONS FOR SURGERY: Tracy Harvey is a 74 y.o. year old female who fell and sustained a fracture of the left olecranon. After discussion of the risks and benefits of surgical intervention, the patient expressed understanding of the risks benefits and agree with plans for open reduction and internal fixation of the fracture.   The risks, benefits, and alternatives were discussed at length including but not limited to the risks of infection, bleeding, nerve injury, stiffness, blood clots, the need for revision surgery, cardiopulmonary complications, among others, and they were willing to proceed.  PROCEDURE IN DETAIL: The patient was brought into the operating room and, after adequate general anesthesia was achieved, the patient was placed in a right lateral decubitus position.  A tourniquet was placed on the patient's upper arm. The patient's elbow and arm were cleaned and prepped with alcohol and DuraPrep and draped in the usual sterile fashion. A "timeout" was performed as per usual protocol. The upper extremity was exsanguinated using an Esmarch, and the tourniquet was inflated to 250 mmHg. A posterior incision was made and dissection was carried down to the fracture site. A hemarthrosis was evacuated and the joint was irrigated with copious amounts of normal saline with antibiotic solution. The fracture site was carefully debrided of hematoma and soft tissue. A  provisional reduction was performed and maintained using bone reduction forceps with points. Position and reduction was confirmed using C-arm. Two K wires were then advanced in a parallel fashion supposed to engage the anterior cortex of the ulna.  Again, reduction and position of K wires was confirmed using the FluoroScan.  A drill was used to create a hole through the ulnar shaft and the wire was inserted through the site.  A 14-gauge Angiocath catheter was passed behind the K wires through the triceps tendon and a the wire was passed. The wire was then adjusted to form a figure-of-eight pattern. The wire was tensioned and then twisted. Excellent compression at the fracture site was appreciated. The proximal portion of the K wires was then bent and impacted so as to engage the wire.  Tourniquet was deflated after total tourniquet time of 83 minutes. Hemostasis was achieved using electrocautery. Subcutaneous tissue was approximated in layers using first #0 Vicryl followed by #2-0 Vicryl. Skin was closed with skin staples. 0.25% Marcaine with epinephrine was injected along the incision site. A sterile dressing was applied followed by application of a knee range of motion brace which was locked in extension.  The patient tolerated the procedure well and was transported to the recovery room in stable condition.  [name] M.D.

## 2019-11-26 NOTE — Progress Notes (Signed)
Pt having vomiting. Pt given second dose of zofran with little benefit. Dr. Kayleen Memos notified. Acknowledged. Orders received.

## 2019-11-26 NOTE — Discharge Instructions (Signed)

## 2019-11-26 NOTE — Anesthesia Preprocedure Evaluation (Signed)
Anesthesia Evaluation  Patient identified by MRN, date of birth, ID band Patient awake    Reviewed: Allergy & Precautions, NPO status , Patient's Chart, lab work & pertinent test results, reviewed documented beta blocker date and time   History of Anesthesia Complications (+) PONV and history of anesthetic complications  Airway Mallampati: III  TM Distance: <3 FB     Dental  (+) Chipped, Poor Dentition   Pulmonary shortness of breath and with exertion, asthma ,           Cardiovascular hypertension, Pt. on medications      Neuro/Psych  Headaches, negative psych ROS   GI/Hepatic Neg liver ROS, hiatal hernia, GERD  ,  Endo/Other  negative endocrine ROS  Renal/GU negative Renal ROS     Musculoskeletal  (+) Arthritis ,   Abdominal   Peds  Hematology negative hematology ROS (+)   Anesthesia Other Findings Past Medical History: No date: Arthritis No date: Asthma No date: Cancer ([place])     Comment:  SKIN CANCER-BASAL CELL No date: Complication of anesthesia No date: Foot fracture, left     Comment:  WEARING BRACE No date: GERD (gastroesophageal reflux disease) No date: Headache     Comment:  H/O MIGRAINES No date: History of hiatal hernia No date: Hypertension No date: IBS (irritable bowel syndrome) No date: PONV (postoperative nausea and vomiting) No date: Shortness of breath dyspnea No date: Wheezing     Comment:  OCC  Reproductive/Obstetrics                             Anesthesia Physical  Anesthesia Plan  ASA: III  Anesthesia Plan: General   Post-op Pain Management:    Induction: Intravenous  PONV Risk Score and Plan:   Airway Management Planned: Oral ETT  Additional Equipment:   Intra-op Plan:   Post-operative Plan: Extubation in OR  Informed Consent: I have reviewed the patients History and Physical, chart, labs and discussed the procedure including the risks,  benefits and alternatives for the proposed anesthesia with the patient or authorized representative who has indicated his/her understanding and acceptance.       Plan Discussed with: CRNA  Anesthesia Plan Comments:         Anesthesia Quick Evaluation

## 2019-11-26 NOTE — H&P (Signed)
The patient has been re-examined, and the chart reviewed, and there have been no interval changes to the documented history and physical.    The risks, benefits, and alternatives have been discussed at length. The patient expressed understanding of the risks benefits and agreed with plans for surgical intervention.  James P. Hooten, Jr. M.D.    

## 2019-11-26 NOTE — Transfer of Care (Signed)
Immediate Anesthesia Transfer of Care Note  Patient: Tracy Harvey  Procedure(s) Performed: OPEN REDUCTION INTERNAL FIXATION (ORIF) ELBOW/OLECRANON FRACTURE (Left Elbow)  Patient Location: PACU  Anesthesia Type:General  Level of Consciousness: awake, alert  and oriented  Airway & Oxygen Therapy: Patient Spontanous Breathing and Patient connected to nasal cannula oxygen  Post-op Assessment: Report given to RN and Post -op Vital signs reviewed and stable  Post vital signs: Reviewed and stable  Last Vitals:  Vitals Value Taken Time  BP    Temp    Pulse    Resp    SpO2      Last Pain:  Vitals:   11/26/19 0936  TempSrc: Temporal  PainSc: 0-No pain      Patients Stated Pain Goal: 0 ([id number])  Complications: No apparent anesthesia complications

## 2019-11-26 NOTE — Anesthesia Procedure Notes (Signed)
Procedure Name: Intubation Date/Time: 11/26/2019 10:24 AM Performed by: Chanetta Marshall, CRNA Pre-anesthesia Checklist: Patient identified, Emergency Drugs available, Suction available and Patient being monitored Patient Re-evaluated:Patient Re-evaluated prior to induction Oxygen Delivery Method: Circle system utilized Preoxygenation: Pre-oxygenation with 100% oxygen Induction Type: IV induction Laryngoscope Size: McGraph and 3 Grade View: Grade II Tube type: Oral Number of attempts: 1 Airway Equipment and Method: Stylet and Video-laryngoscopy Placement Confirmation: ETT inserted through vocal cords under direct vision,  positive ETCO2,  breath sounds checked- equal and bilateral and CO2 detector Secured at: 21 cm Tube secured with: Tape Dental Injury: Teeth and Oropharynx as per pre-operative assessment

## 2019-11-27 NOTE — Anesthesia Postprocedure Evaluation (Signed)
Anesthesia Post Note  Patient: Tracy Harvey  Procedure(s) Performed: OPEN REDUCTION INTERNAL FIXATION (ORIF) ELBOW/OLECRANON FRACTURE (Left Elbow)  Patient location during evaluation: PACU Anesthesia Type: General Level of consciousness: awake and alert and oriented Pain management: pain level controlled Vital Signs Assessment: post-procedure vital signs reviewed and stable Respiratory status: spontaneous breathing Cardiovascular status: blood pressure returned to baseline Anesthetic complications: no     Last Vitals:  Vitals:   11/26/19 1452 11/26/19 1545  BP: (!) 188/75 (!) 164/83  Pulse: 78 70  Resp: 18 16  Temp: (!) 36.1 C   SpO2: 99% 99%    Last Pain:  Vitals:   11/26/19 1545  TempSrc:   PainSc: 4                  Colleen Donahoe

## 2020-04-12 ENCOUNTER — Other Ambulatory Visit: Payer: Self-pay | Admitting: Podiatry

## 2020-04-13 ENCOUNTER — Encounter: Payer: Self-pay | Admitting: Podiatry

## 2020-04-25 ENCOUNTER — Other Ambulatory Visit: Payer: Self-pay

## 2020-04-25 ENCOUNTER — Other Ambulatory Visit
Admission: RE | Admit: 2020-04-25 | Discharge: 2020-04-25 | Disposition: A | Discharge status: Home / Self Care | Payer: Medicare Other | Source: Ambulatory Visit | Attending: Podiatry | Admitting: Podiatry

## 2020-04-25 DIAGNOSIS — Z20822 Contact with and (suspected) exposure to covid-19: Secondary | ICD-10-CM | POA: Diagnosis not present

## 2020-04-25 DIAGNOSIS — Z01812 Encounter for preprocedural laboratory examination: Secondary | ICD-10-CM | POA: Diagnosis present

## 2020-04-25 LAB — SARS CORONAVIRUS 2 (TAT 6-24 HRS): SARS Coronavirus 2: NEGATIVE

## 2020-04-27 ENCOUNTER — Ambulatory Visit: Payer: Medicare Other | Admitting: Anesthesiology

## 2020-04-27 ENCOUNTER — Ambulatory Visit
Admission: RE | Admit: 2020-04-27 | Discharge: 2020-04-27 | Disposition: A | Discharge status: Home / Self Care | Payer: Medicare Other | Attending: Podiatry | Admitting: Podiatry

## 2020-04-27 ENCOUNTER — Encounter
Admission: RE | Disposition: A | Discharge status: Home / Self Care | Payer: Self-pay | Source: Home / Self Care | Attending: Podiatry

## 2020-04-27 ENCOUNTER — Encounter: Payer: Self-pay | Admitting: Podiatry

## 2020-04-27 ENCOUNTER — Other Ambulatory Visit: Payer: Self-pay

## 2020-04-27 DIAGNOSIS — I10 Essential (primary) hypertension: Secondary | ICD-10-CM | POA: Diagnosis not present

## 2020-04-27 DIAGNOSIS — Z7983 Long term (current) use of bisphosphonates: Secondary | ICD-10-CM | POA: Diagnosis not present

## 2020-04-27 DIAGNOSIS — M810 Age-related osteoporosis without current pathological fracture: Secondary | ICD-10-CM | POA: Diagnosis not present

## 2020-04-27 DIAGNOSIS — Z7982 Long term (current) use of aspirin: Secondary | ICD-10-CM | POA: Diagnosis not present

## 2020-04-27 DIAGNOSIS — M899 Disorder of bone, unspecified: Secondary | ICD-10-CM | POA: Insufficient documentation

## 2020-04-27 DIAGNOSIS — J45909 Unspecified asthma, uncomplicated: Secondary | ICD-10-CM | POA: Insufficient documentation

## 2020-04-27 DIAGNOSIS — Z7951 Long term (current) use of inhaled steroids: Secondary | ICD-10-CM | POA: Diagnosis not present

## 2020-04-27 DIAGNOSIS — Z79890 Hormone replacement therapy (postmenopausal): Secondary | ICD-10-CM | POA: Insufficient documentation

## 2020-04-27 DIAGNOSIS — Q669 Congenital deformity of feet, unspecified, unspecified foot: Secondary | ICD-10-CM | POA: Insufficient documentation

## 2020-04-27 DIAGNOSIS — M205X2 Other deformities of toe(s) (acquired), left foot: Secondary | ICD-10-CM | POA: Insufficient documentation

## 2020-04-27 DIAGNOSIS — Z79899 Other long term (current) drug therapy: Secondary | ICD-10-CM | POA: Insufficient documentation

## 2020-04-27 DIAGNOSIS — K219 Gastro-esophageal reflux disease without esophagitis: Secondary | ICD-10-CM | POA: Insufficient documentation

## 2020-04-27 HISTORY — PX: BONE EXCISION: SHX6730

## 2020-04-27 HISTORY — DX: Cardiac arrhythmia, unspecified: I49.9

## 2020-04-27 SURGERY — BONE EXCISION
Anesthesia: General | Site: Foot | Laterality: Left

## 2020-04-27 MED ORDER — CLINDAMYCIN PHOSPHATE 900 MG/50ML IV SOLN
900.0000 mg | INTRAVENOUS | Status: AC
  Administered 2020-04-27: 900 mg via INTRAVENOUS

## 2020-04-27 MED ORDER — LACTATED RINGERS IV SOLN: INTRAVENOUS | Status: DC

## 2020-04-27 MED ORDER — FENTANYL CITRATE (PF) 100 MCG/2ML IJ SOLN
INTRAMUSCULAR | Status: DC | PRN
  Administered 2020-04-27: 50 ug via INTRAVENOUS

## 2020-04-27 MED ORDER — BUPIVACAINE HCL (PF) 0.25 % IJ SOLN
INTRAMUSCULAR | Status: DC | PRN
  Administered 2020-04-27: 1.5 mL

## 2020-04-27 MED ORDER — BUPIVACAINE LIPOSOME 1.3 % IJ SUSP
INTRAMUSCULAR | Status: DC | PRN
  Administered 2020-04-27: 1.5 mL

## 2020-04-27 MED ORDER — ONDANSETRON HCL 4 MG PO TABS: 2.0000 mg | ORAL_TABLET | Freq: Three times a day (TID) | ORAL | 0 refills | Status: DC | PRN

## 2020-04-27 MED ORDER — PROPOFOL 10 MG/ML IV BOLUS
INTRAVENOUS | Status: DC | PRN
Start: 2020-04-27 — End: 2020-04-27
  Administered 2020-04-27: 40 mg via INTRAVENOUS

## 2020-04-27 MED ORDER — LIDOCAINE HCL (CARDIAC) PF 100 MG/5ML IV SOSY
PREFILLED_SYRINGE | INTRAVENOUS | Status: DC | PRN
  Administered 2020-04-27: 40 mg via INTRAVENOUS

## 2020-04-27 MED ORDER — DEXAMETHASONE SODIUM PHOSPHATE 4 MG/ML IJ SOLN
INTRAMUSCULAR | Status: DC | PRN
  Administered 2020-04-27: 4 mg via INTRAVENOUS

## 2020-04-27 MED ORDER — HYDROCODONE-ACETAMINOPHEN 5-325 MG PO TABS: 1.0000 | ORAL_TABLET | Freq: Four times a day (QID) | ORAL | 0 refills | Status: DC | PRN

## 2020-04-27 MED ORDER — FENTANYL CITRATE (PF) 100 MCG/2ML IJ SOLN: 25.0000 ug | INTRAMUSCULAR | Status: DC | PRN

## 2020-04-27 MED ORDER — PROPOFOL 500 MG/50ML IV EMUL
INTRAVENOUS | Status: DC | PRN
  Administered 2020-04-27: 75 ug/kg/min via INTRAVENOUS

## 2020-04-27 MED ORDER — ONDANSETRON HCL 4 MG/2ML IJ SOLN
INTRAMUSCULAR | Status: DC | PRN
  Administered 2020-04-27: 4 mg via INTRAVENOUS

## 2020-04-27 MED ORDER — OXYCODONE HCL 5 MG PO TABS: 5.0000 mg | ORAL_TABLET | Freq: Once | ORAL | Status: DC | PRN

## 2020-04-27 MED ORDER — POVIDONE-IODINE 7.5 % EX SOLN: Freq: Once | CUTANEOUS | Status: AC

## 2020-04-27 MED ORDER — OXYCODONE HCL 5 MG/5ML PO SOLN: 5.0000 mg | Freq: Once | ORAL | Status: DC | PRN

## 2020-04-27 MED ORDER — MIDAZOLAM HCL 2 MG/2ML IJ SOLN
INTRAMUSCULAR | Status: DC | PRN
  Administered 2020-04-27: .5 mg via INTRAVENOUS

## 2020-04-27 SURGICAL SUPPLY — 36 items
BLADE MINI RND TIP GREEN BEAV (BLADE) ×3 IMPLANT
BLADE OSC/SAGITTAL MD 5.5X18 (BLADE) ×3 IMPLANT
BNDG ELASTIC 4X5.8 VLCR NS LF (GAUZE/BANDAGES/DRESSINGS) ×3 IMPLANT
BNDG ESMARK 4X12 TAN STRL LF (GAUZE/BANDAGES/DRESSINGS) ×3 IMPLANT
BNDG GAUZE 4.5X4.1 6PLY STRL (MISCELLANEOUS) ×3 IMPLANT
BNDG STRETCH 4X75 STRL LF (GAUZE/BANDAGES/DRESSINGS) ×3 IMPLANT
CANISTER SUCT 1200ML W/VALVE (MISCELLANEOUS) ×3 IMPLANT
COVER LIGHT HANDLE UNIVERSAL (MISCELLANEOUS) ×6 IMPLANT
COVER PIN YLW 0.028-062 (MISCELLANEOUS) ×3 IMPLANT
CUFF TOURN SGL QUICK 18X4 (TOURNIQUET CUFF) ×3 IMPLANT
DRAPE FLUOR MINI C-ARM 54X84 (DRAPES) ×3 IMPLANT
DURAPREP 26ML APPLICATOR (WOUND CARE) ×3 IMPLANT
ELECT REM PT RETURN 9FT ADLT (ELECTROSURGICAL) ×3
ELECTRODE REM PT RTRN 9FT ADLT (ELECTROSURGICAL) ×1 IMPLANT
ETHIBOND 2 0 GREEN CT 2 30IN (SUTURE) ×3 IMPLANT
GAUZE SPONGE 4X4 12PLY STRL (GAUZE/BANDAGES/DRESSINGS) ×3 IMPLANT
GAUZE XEROFORM 1X8 LF (GAUZE/BANDAGES/DRESSINGS) ×3 IMPLANT
GLOVE BIO SURGEON STRL SZ8 (GLOVE) ×3 IMPLANT
GOWN STRL REUS W/ TWL LRG LVL3 (GOWN DISPOSABLE) ×1 IMPLANT
GOWN STRL REUS W/ TWL XL LVL3 (GOWN DISPOSABLE) ×1 IMPLANT
GOWN STRL REUS W/TWL LRG LVL3 (GOWN DISPOSABLE) ×2
GOWN STRL REUS W/TWL XL LVL3 (GOWN DISPOSABLE) ×2
K-WIRE DBL END TROCAR 6X.045 (WIRE)
KIT TURNOVER KIT A (KITS) ×3 IMPLANT
KWIRE DBL END TROCAR 6X.045 (WIRE) IMPLANT
MICROAIRE KWIRE 0.045 (Wire) ×3 IMPLANT
NEEDLE HYPO 18GX1.5 BLUNT FILL (NEEDLE) ×3 IMPLANT
NEEDLE HYPO 25GX1X1/2 BEV (NEEDLE) ×3 IMPLANT
NS IRRIG 500ML POUR BTL (IV SOLUTION) ×3 IMPLANT
PACK EXTREMITY ARMC (MISCELLANEOUS) ×3 IMPLANT
PENCIL SMOKE EVACUATOR (MISCELLANEOUS) ×3 IMPLANT
RASP SM TEAR CROSS CUT (RASP) ×3 IMPLANT
STOCKINETTE STRL 6IN 960660 (GAUZE/BANDAGES/DRESSINGS) ×3 IMPLANT
SUT ETHILON 5-0 FS-2 18 BLK (SUTURE) ×3 IMPLANT
SUT VIC AB 4-0 FS2 27 (SUTURE) ×3 IMPLANT
SYR 10ML LL (SYRINGE) IMPLANT

## 2020-04-27 NOTE — Anesthesia Postprocedure Evaluation (Signed)
Anesthesia Post Note  Patient: Tracy Harvey  Procedure(s) Performed: PARTIAL EXCISION BONE-PHALANX (Left Foot)     Patient location during evaluation: PACU Anesthesia Type: General Level of consciousness: awake and alert Pain management: pain level controlled Vital Signs Assessment: post-procedure vital signs reviewed and stable Respiratory status: spontaneous breathing Cardiovascular status: stable Anesthetic complications: no   No complications documented.  Gillian Scarce

## 2020-04-27 NOTE — Transfer of Care (Addendum)
Immediate Anesthesia Transfer of Care Note  Patient: Tracy Harvey  Procedure(s) Performed: PARTIAL EXCISION BONE-PHALANX (Left )  Patient Location: PACU  Anesthesia Type: General  Level of Consciousness: awake, alert  and patient cooperative  Airway and Oxygen Therapy: Patient Spontanous Breathing and Patient connected to supplemental oxygen  Post-op Assessment: Post-op Vital signs reviewed, Patient's Cardiovascular Status Stable, Respiratory Function Stable, Patent Airway and No signs of Nausea or vomiting  Post-op Vital Signs: Reviewed and stable  Complications: No complications documented.

## 2020-04-27 NOTE — H&P (Signed)
H and P has been reviewed and no changes are noted.  

## 2020-04-27 NOTE — Discharge Instructions (Signed)
[place] DR. TROXLER, DR. Vickki Muff, AND DR. Culver   1. Take your medication as prescribed.  Pain medication should be taken only as needed.  2. Keep the dressing clean, dry and intact.  3. Keep your foot elevated above the heart level for the first 48 hours.  4. Walking to the bathroom and brief periods of walking are acceptable, unless we have instructed you to be non-weight bearing.  5. Always wear your post-op shoe when walking.  Always use your crutches if you are to be non-weight bearing.  6. Do not take a shower. Baths are permissible as long as the foot is kept out of the water.   7. Every hour you are awake:  - Bend your knee 15 times. - Flex foot 15 times - Massage calf 15 times  8. Call [facility] [phone]) if any of the following problems occur: - You develop a temperature or fever. - The bandage becomes saturated with blood. - Medication does not stop your pain. - Injury of the foot occurs. - Any symptoms of infection including redness, odor, or red streaks running from wound. -  General Anesthesia, Adult, Care After This sheet gives you information about how to care for yourself after your procedure. Your health care provider may also give you more specific instructions. If you have problems or questions, contact your health care provider. What can I expect after the procedure? After the procedure, the following side effects are common:  Pain or discomfort at the IV site.  Nausea.  Vomiting.  Sore throat.  Trouble concentrating.  Feeling cold or chills.  Weak or tired.  Sleepiness and fatigue.  Soreness and body aches. These side effects can affect parts of the body that were not involved in surgery. Follow these instructions at home:  For at least 24 hours after the procedure:  Have a responsible adult stay with you. It is  important to have someone help care for you until you are awake and alert.  Rest as needed.  Do not: ? Participate in activities in which you could fall or become injured. ? Drive. ? Use heavy machinery. ? Drink alcohol. ? Take sleeping pills or medicines that cause drowsiness. ? Make important decisions or sign legal documents. ? Take care of children on your own. Eating and drinking  Follow any instructions from your health care provider about eating or drinking restrictions.  When you feel hungry, start by eating small amounts of foods that are soft and easy to digest (bland), such as toast. Gradually return to your regular diet.  Drink enough fluid to keep your urine pale yellow.  If you vomit, rehydrate by drinking water, juice, or clear broth. General instructions  If you have sleep apnea, surgery and certain medicines can increase your risk for breathing problems. Follow instructions from your health care provider about wearing your sleep device: ? Anytime you are sleeping, including during daytime naps. ? While taking prescription pain medicines, sleeping medicines, or medicines that make you drowsy.  Return to your normal activities as told by your health care provider. Ask your health care provider what activities are safe for you.  Take over-the-counter and prescription medicines only as told by your health care provider.  If you smoke, do not smoke without supervision.  Keep all follow-up visits as told by your health care provider. This is important. Contact a health care provider if:  You have nausea or vomiting that does not get better with medicine.  You cannot eat or drink without vomiting.  You have pain that does not get better with medicine.  You are unable to pass urine.  You develop a skin rash.  You have a fever.  You have redness around your IV site that gets worse. Get help right away if:  You have difficulty breathing.  You have chest  pain.  You have blood in your urine or stool, or you vomit blood. Summary  After the procedure, it is common to have a sore throat or nausea. It is also common to feel tired.  Have a responsible adult stay with you for the first 24 hours after general anesthesia. It is important to have someone help care for you until you are awake and alert.  When you feel hungry, start by eating small amounts of foods that are soft and easy to digest (bland), such as toast. Gradually return to your regular diet.  Drink enough fluid to keep your urine pale yellow.  Return to your normal activities as told by your health care provider. Ask your health care provider what activities are safe for you. This information is not intended to replace advice given to you by your health care provider. Make sure you discuss any questions you have with your health care provider. Document Revised: 11/07/2017 Document Reviewed: 06/20/2017 Elsevier Patient Education  2020 [place] for Discharge Teaching: EXPAREL (bupivacaine liposome injectable suspension)   Your surgeon or anesthesiologist gave you EXPAREL(bupivacaine) to help control your pain after surgery.   EXPAREL is a local anesthetic that provides pain relief by numbing the tissue around the surgical site.  EXPAREL is designed to release pain medication over time and can control pain for up to 72 hours.  Depending on how you respond to EXPAREL, you may require less pain medication during your recovery.  Possible side effects:  Temporary loss of sensation or ability to move in the area where bupivacaine was injected.  Nausea, vomiting, constipation  Rarely, numbness and tingling in your mouth or lips, lightheadedness, or anxiety may occur.  Call your doctor right away if you think you may be experiencing any of these sensations, or if you have other questions regarding possible side effects.  Follow all other discharge instructions  given to you by your surgeon or nurse. Eat a healthy diet and drink plenty of water or other fluids.  If you return to the hospital for any reason within 96 hours following the administration of EXPAREL, it is important for health care providers to know that you have received this anesthetic. A teal colored band has been placed on your arm with the date, time and amount of EXPAREL you have received in order to alert and inform your health care providers. Please leave this armband in place for the full 96 hours following administration, and then you may remove the band.

## 2020-04-27 NOTE — Anesthesia Preprocedure Evaluation (Signed)
Anesthesia Evaluation  Patient identified by MRN, date of birth, ID band Patient awake    Reviewed: Allergy & Precautions, H&P , NPO status , Patient's Chart, lab work & pertinent test results  History of Anesthesia Complications (+) PONV and history of anesthetic complications  Airway Mallampati: I  TM Distance: >3 FB Neck ROM: full    Dental no notable dental hx.    Pulmonary shortness of breath,    Pulmonary exam normal        Cardiovascular hypertension, On Medications Normal cardiovascular exam Rhythm:regular Rate:Normal     Neuro/Psych  Headaches, negative psych ROS   GI/Hepatic Neg liver ROS, hiatal hernia, GERD  ,  Endo/Other  negative endocrine ROS  Renal/GU negative Renal ROS  negative genitourinary   Musculoskeletal   Abdominal   Peds  Hematology negative hematology ROS (+)   Anesthesia Other Findings   Reproductive/Obstetrics                             Anesthesia Physical Anesthesia Plan  ASA: I  Anesthesia Plan: General   Post-op Pain Management:    Induction:   PONV Risk Score and Plan:   Airway Management Planned:   Additional Equipment:   Intra-op Plan:   Post-operative Plan:   Informed Consent: I have reviewed the patients History and Physical, chart, labs and discussed the procedure including the risks, benefits and alternatives for the proposed anesthesia with the patient or authorized representative who has indicated his/her understanding and acceptance.       Plan Discussed with:   Anesthesia Plan Comments:         Anesthesia Quick Evaluation

## 2020-04-27 NOTE — Op Note (Signed)
Operative note   Surgeon: Dr. Albertine Patricia, DPM.    Assistant: None    Preop diagnosis: Hyperextension deformity second toe left foot with exostosis    Postop diagnosis: Same    Procedure:   1.  Arthroplasty DIP joint second toe left foot with fusion and K wire fixation           EBL: Less than 5 cc    Anesthesia:general delivered by the anesthesia team.  I injected preoperatively 2 cc of Exparel mixed with 1 cc of Marcaine    Hemostasis: Ankle tourniquet 2 and 20 mils work pressure for 20 minutes    Specimen: None    Complications: None    Operative indications: Chronic deformity to the second toe at the DIP joint with secondary skin lesion and chronic pain unresponsive to conservative care    Procedure:  Patient was brought into the OR and placed on the operating table in thesupine position. After anesthesia was obtained theleft lower extremity was prepped and draped in usual sterile fashion.  Operative Report: This time attention was directed to the second toe of the left foot where a linear incision was made over the DIP joint.  Tissues retracted mediolaterally extensor tendon was identified incised transversely reflected proximally and distally.  Articular cartilage off the distal phalanx base was then resected as was the articular cartilage off of the middle phalanx head.  There was redundancy of scar tissue on the plantar aspects I resected that found the residual extensor tendon and sutured it back into the bone at the distal phalanx with 2-0 M[place]il[place] nonabsorbable suture.  Once this was accomplished I ran a 0.045 K wire through the distal phalanx and retrograded into the middle phalanx and up the shaft of the proximal phalanx.  There was a pre-existing hammerlock implant across the DIP joint which I was able to maneuver around and stay in the bone as indicated in the FluoroScan images I took.  There is an copiously irrigated irrigated a 4-0 Vicryl suture was used to close  the extensor tendon distally and skin was closed with 5-0 nylon horizontal mattress and simple erupted combination.  A sterile compressive dressing was placed across wound assisting of Xeroform gauze 4 x 4's Kling and Kerlix.  Tourniquet released and promptly vascularity seen return to the digits of the left foot.    Patient tolerated the procedure and anesthesia well.  Was transported from the OR to the PACU with all vital signs stable and vascular status intact. To be discharged per routine protocol.  Will follow up in approximately 1 week in the outpatient clinic.

## 2020-04-28 ENCOUNTER — Encounter: Payer: Self-pay | Admitting: Podiatry

## 2020-09-22 ENCOUNTER — Other Ambulatory Visit: Payer: Self-pay | Admitting: Internal Medicine

## 2020-09-22 DIAGNOSIS — Z1231 Encounter for screening mammogram for malignant neoplasm of breast: Secondary | ICD-10-CM

## 2020-10-23 ENCOUNTER — Other Ambulatory Visit: Payer: Self-pay

## 2020-10-23 ENCOUNTER — Encounter: Payer: Self-pay | Admitting: Ophthalmology

## 2020-10-24 ENCOUNTER — Other Ambulatory Visit
Admission: RE | Admit: 2020-10-24 | Discharge: 2020-10-24 | Disposition: A | Discharge status: Home / Self Care | Payer: Medicare Other | Source: Ambulatory Visit | Attending: Ophthalmology | Admitting: Ophthalmology

## 2020-10-24 DIAGNOSIS — Z01812 Encounter for preprocedural laboratory examination: Secondary | ICD-10-CM | POA: Diagnosis present

## 2020-10-24 DIAGNOSIS — Z20822 Contact with and (suspected) exposure to covid-19: Secondary | ICD-10-CM | POA: Insufficient documentation

## 2020-10-24 NOTE — Discharge Instructions (Signed)

## 2020-10-25 LAB — SARS CORONAVIRUS 2 (TAT 6-24 HRS): SARS Coronavirus 2: NEGATIVE

## 2020-10-26 ENCOUNTER — Ambulatory Visit: Payer: Medicare Other | Admitting: Anesthesiology

## 2020-10-26 ENCOUNTER — Ambulatory Visit
Admission: RE | Admit: 2020-10-26 | Discharge: 2020-10-26 | Disposition: A | Discharge status: Home / Self Care | Payer: Medicare Other | Attending: Ophthalmology | Admitting: Ophthalmology

## 2020-10-26 ENCOUNTER — Encounter: Payer: Self-pay | Admitting: Ophthalmology

## 2020-10-26 ENCOUNTER — Encounter
Admission: RE | Disposition: A | Discharge status: Home / Self Care | Payer: Self-pay | Source: Home / Self Care | Attending: Ophthalmology

## 2020-10-26 ENCOUNTER — Other Ambulatory Visit: Payer: Self-pay

## 2020-10-26 DIAGNOSIS — Z7982 Long term (current) use of aspirin: Secondary | ICD-10-CM | POA: Insufficient documentation

## 2020-10-26 DIAGNOSIS — Z7951 Long term (current) use of inhaled steroids: Secondary | ICD-10-CM | POA: Diagnosis not present

## 2020-10-26 DIAGNOSIS — Z85828 Personal history of other malignant neoplasm of skin: Secondary | ICD-10-CM | POA: Insufficient documentation

## 2020-10-26 DIAGNOSIS — Z79899 Other long term (current) drug therapy: Secondary | ICD-10-CM | POA: Diagnosis not present

## 2020-10-26 DIAGNOSIS — Z79890 Hormone replacement therapy (postmenopausal): Secondary | ICD-10-CM | POA: Diagnosis not present

## 2020-10-26 DIAGNOSIS — H2512 Age-related nuclear cataract, left eye: Secondary | ICD-10-CM | POA: Diagnosis not present

## 2020-10-26 DIAGNOSIS — Z7983 Long term (current) use of bisphosphonates: Secondary | ICD-10-CM | POA: Diagnosis not present

## 2020-10-26 HISTORY — PX: CATARACT EXTRACTION W/PHACO: SHX586

## 2020-10-26 SURGERY — PHACOEMULSIFICATION, CATARACT, WITH IOL INSERTION
Anesthesia: Monitor Anesthesia Care | Site: Eye | Laterality: Left

## 2020-10-26 MED ORDER — EPINEPHRINE PF 1 MG/ML IJ SOLN
INTRAOCULAR | Status: DC | PRN
  Administered 2020-10-26: 53 mL via OPHTHALMIC

## 2020-10-26 MED ORDER — MOXIFLOXACIN HCL 0.5 % OP SOLN
OPHTHALMIC | Status: DC | PRN
  Administered 2020-10-26: 0.2 mL via OPHTHALMIC

## 2020-10-26 MED ORDER — MIDAZOLAM HCL 2 MG/2ML IJ SOLN
INTRAMUSCULAR | Status: DC | PRN
  Administered 2020-10-26: 1 mg via INTRAVENOUS

## 2020-10-26 MED ORDER — ARMC OPHTHALMIC DILATING DROPS
1.0000 "application " | OPHTHALMIC | Status: DC | PRN
  Administered 2020-10-26 (×3): 1 via OPHTHALMIC

## 2020-10-26 MED ORDER — FENTANYL CITRATE (PF) 100 MCG/2ML IJ SOLN
INTRAMUSCULAR | Status: DC | PRN
  Administered 2020-10-26: 50 ug via INTRAVENOUS

## 2020-10-26 MED ORDER — ONDANSETRON HCL 4 MG/2ML IJ SOLN
INTRAMUSCULAR | Status: DC | PRN
  Administered 2020-10-26: 4 mg via INTRAVENOUS

## 2020-10-26 MED ORDER — TETRACAINE HCL 0.5 % OP SOLN
1.0000 [drp] | OPHTHALMIC | Status: DC | PRN
  Administered 2020-10-26 (×3): 1 [drp] via OPHTHALMIC

## 2020-10-26 MED ORDER — ACETAMINOPHEN 325 MG PO TABS: 325.0000 mg | ORAL_TABLET | Freq: Once | ORAL | Status: DC

## 2020-10-26 MED ORDER — LIDOCAINE HCL (PF) 2 % IJ SOLN
INTRAOCULAR | Status: DC | PRN
  Administered 2020-10-26: 2 mL

## 2020-10-26 MED ORDER — ACETAMINOPHEN 160 MG/5ML PO SOLN: 325.0000 mg | Freq: Once | ORAL | Status: DC

## 2020-10-26 MED ORDER — NA CHONDROIT SULF-NA HYALURON 40-17 MG/ML IO SOLN
INTRAOCULAR | Status: DC | PRN
  Administered 2020-10-26: 1 mL via INTRAOCULAR

## 2020-10-26 MED ORDER — LACTATED RINGERS IV SOLN: INTRAVENOUS | Status: DC

## 2020-10-26 MED ORDER — BRIMONIDINE TARTRATE-TIMOLOL 0.2-0.5 % OP SOLN
OPHTHALMIC | Status: DC | PRN
  Administered 2020-10-26: 1 [drp] via OPHTHALMIC

## 2020-10-26 SURGICAL SUPPLY — 19 items
CANNULA ANT/CHMB 27G (MISCELLANEOUS) ×2 IMPLANT
CANNULA ANT/CHMB 27GA (MISCELLANEOUS) ×6 IMPLANT
GLOVE SURG LX 8.0 MICRO (GLOVE) ×2
GLOVE SURG LX STRL 8.0 MICRO (GLOVE) ×1 IMPLANT
GLOVE SURG TRIUMPH 8.0 PF LTX (GLOVE) ×3 IMPLANT
GOWN STRL REUS W/ TWL LRG LVL3 (GOWN DISPOSABLE) ×2 IMPLANT
GOWN STRL REUS W/TWL LRG LVL3 (GOWN DISPOSABLE) ×6
LENS IOL ACRYSOF IQ 20.5 (Intraocular Lens) ×2 IMPLANT
MARKER SKIN DUAL TIP RULER LAB (MISCELLANEOUS) ×3 IMPLANT
NDL FILTER BLUNT 18X1 1/2 (NEEDLE) ×1 IMPLANT
NEEDLE FILTER BLUNT 18X 1/2SAF (NEEDLE) ×2
NEEDLE FILTER BLUNT 18X1 1/2 (NEEDLE) ×1 IMPLANT
PACK EYE AFTER SURG (MISCELLANEOUS) ×3 IMPLANT
PACK OPTHALMIC (MISCELLANEOUS) ×3 IMPLANT
PACK PORFILIO (MISCELLANEOUS) ×3 IMPLANT
SYR 3ML LL SCALE MARK (SYRINGE) ×3 IMPLANT
SYR TB 1ML LUER SLIP (SYRINGE) ×3 IMPLANT
WATER STERILE IRR 250ML POUR (IV SOLUTION) ×3 IMPLANT
WIPE NON LINTING 3.25X3.25 (MISCELLANEOUS) ×3 IMPLANT

## 2020-10-26 NOTE — H&P (Signed)
[place]   Primary Care Physician:  Rusty Aus, MD Ophthalmologist: Dr. George Ina  Pre-Procedure History & Physical: HPI:  Tracy Harvey is a 74 y.o. female here for cataract surgery.   Past Medical History:  Diagnosis Date  . Arthritis   . Asthma   . Cancer (HCC)    SKIN CANCER-BASAL CELL  . Complication of anesthesia   . Dysrhythmia    PACs, SVT  . Foot fracture, left    WEARING BRACE  . GERD (gastroesophageal reflux disease)   . Headache    H/O MIGRAINES  . History of hiatal hernia   . Hypertension   . IBS (irritable bowel syndrome)   . PONV (postoperative nausea and vomiting)   . Shortness of breath dyspnea   . Wheezing    OCC    Past Surgical History:  Procedure Laterality Date  . ABDOMINAL HYSTERECTOMY    . BONE EXCISION Left 04/27/2020   Procedure: PARTIAL EXCISION BONE-PHALANX;  Surgeon: Albertine Patricia, DPM;  Location: [place];  Service: Podiatry;  Laterality: Left;  IVA LOCAL  . CATARACT EXTRACTION W/PHACO Right 02/05/2016   Procedure: CATARACT EXTRACTION PHACO AND INTRAOCULAR LENS PLACEMENT (IOC);  Surgeon: Estill Cotta, MD;  Location: [facility];  Service: Ophthalmology;  Laterality: Right;  [place] 01:02 AP% 23.9 CDE 25.0 fluid pack lot # [id number]  . colonoscopy with polypectomy    . COLONOSCOPY WITH PROPOFOL N/A 02/03/2019   Procedure: COLONOSCOPY WITH PROPOFOL;  Surgeon: Manya Silvas, MD;  Location: [facility] ENDOSCOPY;  Service: Endoscopy;  Laterality: N/A;  . ESOPHAGOGASTRODUODENOSCOPY    . FOOT SURGERY    . HAND SURGERY    . MOUTH SURGERY    . NASAL SINUS SURGERY    . ORIF ELBOW FRACTURE Left 11/26/2019   Procedure: OPEN REDUCTION INTERNAL FIXATION (ORIF) ELBOW/OLECRANON FRACTURE;  Surgeon: Dereck Leep, MD;  Location: [facility] OR[facility];  Service: Orthopedics;  Laterality: Left;  . TONSILLECTOMY      Prior to Admission medications   Medication Sig Start Date End Date Taking? Authorizing Provider  acetaminophen (TYLENOL) 500 MG  tablet Take 1,000 mg by mouth every 6 (six) hours as needed (for pain.).    Yes [provider]  albuterol (PROVENTIL HFA;VENTOLIN HFA) 108 (90 Base) MCG/ACT inhaler Inhale 2 puffs into the lungs every 6 (six) hours as needed for wheezing or shortness of breath.   Yes [provider]  alendronate (FOSAMAX) 70 MG tablet Take 70 mg by mouth every Wednesday. Take with a full glass of water on an empty stomach.   Yes [provider]  amLODipine (NORVASC) 5 MG tablet Take 2.5 mg by mouth every evening.    Yes [provider]  Ascorbic Acid (VITAMIN C) 100 MG tablet Take 500 mg by mouth daily.   Yes [provider]  aspirin 81 MG chewable tablet Chew 81 mg by mouth daily.   Yes [provider]  azelastine (ASTELIN) 0.1 % nasal spray Place into both nostrils daily as needed for rhinitis. Use in each nostril as directed   Yes [provider]  BIOTIN PO Take by mouth.   Yes [provider]  budesonide-formoterol (SYMBICORT) 160-4.5 MCG/ACT inhaler Inhale 2 puffs into the lungs 2 (two) times daily.   Yes [provider]  Calcium Carb-Cholecalciferol (CALCIUM 600-D PO) Take 1 tablet by mouth 3 (three) times daily.   Yes [provider]  Cholecalciferol (VITAMIN D3) 50 MCG (2000 UT) TABS Take 2,000 Units by mouth  daily after breakfast.   Yes [provider]  diltiazem (TIAZAC) 180 MG 24 hr capsule Take 180 mg by mouth at bedtime.   Yes [provider]  estradiol (ESTRACE) 1 MG tablet Take 1 mg by mouth daily after breakfast.    Yes [provider]  fexofenadine (ALLEGRA) 180 MG tablet Take 90 mg by mouth daily.   Yes [provider]  fluticasone (FLONASE) 50 MCG/ACT nasal spray Place 2 sprays into both nostrils daily after breakfast.   Yes [provider]  loperamide (IMODIUM) 2 MG capsule Take 2 mg by mouth 4 (four) times daily as needed for diarrhea or loose stools.    Yes  [provider]  losartan-hydrochlorothiazide (HYZAAR) 100-12.5 MG tablet Take 1 tablet by mouth daily after breakfast.    Yes [provider]  Magnesium 500 MG TABS Take 250 mg by mouth daily after breakfast. Hold for diarrhea   Yes [provider]  montelukast (SINGULAIR) 10 MG tablet Take 10 mg by mouth at bedtime.   Yes [provider]  Multiple Vitamin (MULTIVITAMIN) tablet Take 1 tablet by mouth daily.   Yes [provider]  omeprazole (PRILOSEC) 20 MG capsule Take 20 mg by mouth daily after breakfast.    Yes [provider]  potassium chloride (K-DUR,KLOR-CON) 10 MEQ tablet Take 10 mEq by mouth daily after breakfast.    Yes [provider]  Probiotic Product (PROBIOTIC PO) Take 1 tablet by mouth daily.   Yes [provider]  triamterene-hydrochlorothiazide (DYAZIDE) 37.5-25 MG capsule Take 1 capsule by mouth daily after breakfast.    Yes [provider]  verapamil (CALAN) 120 MG tablet Take 60 mg by mouth 2 (two) times daily. Breakfast & supper   Yes [provider]  ondansetron (ZOFRAN) 4 MG tablet Take 0.5 tablets (2 mg total) by mouth every 8 (eight) hours as needed for nausea or vomiting. Patient not taking: Reported on 10/23/2020 04/27/20   Albertine Patricia, DPM    Allergies as of 10/05/2020 - Review Complete 04/27/2020  Allergen Reaction Noted  . Augmentin [amoxicillin-pot clavulanate] Nausea And Vomiting 01/23/2016  . Chocolate flavor Other (See Comments) 02/02/2019  . Egg [eggs or egg-derived products] Nausea And Vomiting and Other (See Comments) 11/23/2019  . Eryc [erythromycin] Other (See Comments) 01/23/2016  . Lactase Other (See Comments) 02/02/2019  . Morphine and related Nausea And Vomiting 02/02/2019    Family History  Problem Relation Age of Onset  . Breast cancer Other     Social History   Socioeconomic History  . Marital status: Widowed    Spouse name: Not on file  .  Number of children: Not on file  . Years of education: Not on file  . Highest education level: Not on file  Occupational History  . Not on file  Tobacco Use  . Smoking status: Never Smoker  . Smokeless tobacco: Never Used  Vaping Use  . Vaping Use: Never used  Substance and Sexual Activity  . Alcohol use: No  . Drug use: Never  . Sexual activity: Not on file  Other Topics Concern  . Not on file  Social History Narrative  . Not on file   Social Determinants of Health   Financial Resource Strain: Not on file  Food Insecurity: Not on file  Transportation Needs: Not on file  Physical Activity: Not on file  Stress: Not on file  Social Connections: Not on file  Intimate Partner Violence: Not on file  Review of Systems: See HPI, otherwise negative ROS  Physical Exam: BP (!) 156/85   Pulse 88   Temp 98.2 F (36.8 C) (Temporal)   Resp 18   Ht 5' (1.524 m)   Wt 66.2 kg   SpO2 98%   BMI 28.51 kg/m  General:   Alert,  pleasant and cooperative in NAD Head:  Normocephalic and atraumatic. Respiratory:  Normal work of breathing. Heart:  Regular rate and rhythm.   Impression/Plan: Tracy Harvey is here for cataract surgery.  Risks, benefits, limitations, and alternatives regarding cataract surgery have been reviewed with the patient.  Questions have been answered.  All parties agreeable.   Birder Robson, MD  10/26/2020, 10:47 AM

## 2020-10-26 NOTE — Anesthesia Postprocedure Evaluation (Signed)
Anesthesia Post Note  Patient: Tracy Harvey  Procedure(s) Performed: CATARACT EXTRACTION PHACO AND INTRAOCULAR LENS PLACEMENT (IOC) LEFT 6.73 00:37.8 (Left Eye)     Patient location during evaluation: PACU Anesthesia Type: MAC Level of consciousness: awake and alert and oriented Pain management: satisfactory to patient Vital Signs Assessment: post-procedure vital signs reviewed and stable Respiratory status: spontaneous breathing, nonlabored ventilation and respiratory function stable Cardiovascular status: blood pressure returned to baseline and stable Postop Assessment: Adequate PO intake and No signs of nausea or vomiting Anesthetic complications: no   No complications documented.  Raliegh Ip

## 2020-10-26 NOTE — Op Note (Signed)
PREOPERATIVE DIAGNOSIS:  Nuclear sclerotic cataract of the left eye.   POSTOPERATIVE DIAGNOSIS:  Nuclear sclerotic cataract of the left eye.   OPERATIVE PROCEDURE:@   SURGEON:  Birder Robson, MD.   ANESTHESIA:  Anesthesiologist: Ronelle Nigh, MD CRNA: Cameron Ali, CRNA  1.      Managed anesthesia care. 2.     0.32ml of Shugarcaine was instilled following the paracentesis   COMPLICATIONS:  None.   TECHNIQUE:   Stop and chop   DESCRIPTION OF PROCEDURE:  The patient was examined and consented in the preoperative holding area where the aforementioned topical anesthesia was applied to the left eye and then brought back to the Operating Room where the left eye was prepped and draped in the usual sterile ophthalmic fashion and a lid speculum was placed. A paracentesis was created with the side port blade and the anterior chamber was filled with viscoelastic. A near clear corneal incision was performed with the steel keratome. A continuous curvilinear capsulorrhexis was performed with a cystotome followed by the capsulorrhexis forceps. Hydrodissection and hydrodelineation were carried out with BSS on a blunt cannula. The lens was removed in a stop and chop  technique and the remaining cortical material was removed with the irrigation-aspiration handpiece. The capsular bag was inflated with viscoelastic and the Technis [id number] lens was placed in the capsular bag without complication. The remaining viscoelastic was removed from the eye with the irrigation-aspiration handpiece. The wounds were hydrated. The anterior chamber was flushed with BSS and the eye was inflated to physiologic pressure. 0.22ml Vigamox was placed in the anterior chamber. The wounds were found to be water tight. The eye was dressed with Combigan. The patient was given protective glasses to wear throughout the day and a shield with which to sleep tonight. The patient was also given drops with which to begin a drop regimen today and  will follow-up with me in one day. Implant Name Type Inv. Item Serial No. Manufacturer Lot No. LRB No. Used Action  LENS IOL ACRYSOF IQ 20.5 - [id number] Intraocular Lens LENS IOL ACRYSOF IQ 20.5 [id number] [employer]  Left 1 Implanted    Procedure(s): CATARACT EXTRACTION PHACO AND INTRAOCULAR LENS PLACEMENT (IOC) LEFT 6.73 00:37.8 (Left)  Electronically signed: Birder Robson 10/26/2020 11:11 AM

## 2020-10-26 NOTE — Transfer of Care (Signed)
Immediate Anesthesia Transfer of Care Note  Patient: Tracy Harvey  Procedure(s) Performed: CATARACT EXTRACTION PHACO AND INTRAOCULAR LENS PLACEMENT (IOC) LEFT 6.73 00:37.8 (Left Eye)  Patient Location: PACU  Anesthesia Type: MAC  Level of Consciousness: awake, alert  and patient cooperative  Airway and Oxygen Therapy: Patient Spontanous Breathing and Patient connected to supplemental oxygen  Post-op Assessment: Post-op Vital signs reviewed, Patient's Cardiovascular Status Stable, Respiratory Function Stable, Patent Airway and No signs of Nausea or vomiting  Post-op Vital Signs: Reviewed and stable  Complications: No complications documented.

## 2020-10-26 NOTE — Anesthesia Preprocedure Evaluation (Signed)
Anesthesia Evaluation  Patient identified by MRN, date of birth, ID band Patient awake    Reviewed: Allergy & Precautions, H&P , NPO status , Patient's Chart, lab work & pertinent test results  History of Anesthesia Complications (+) PONV and history of anesthetic complications  Airway Mallampati: II  TM Distance: >3 FB Neck ROM: full    Dental no notable dental hx.    Pulmonary asthma ,    breath sounds clear to auscultation       Cardiovascular hypertension, Normal cardiovascular exam+ dysrhythmias  Rhythm:regular Rate:Normal     Neuro/Psych    GI/Hepatic   Endo/Other    Renal/GU      Musculoskeletal   Abdominal   Peds  Hematology   Anesthesia Other Findings   Reproductive/Obstetrics                             Anesthesia Physical Anesthesia Plan  ASA: III  Anesthesia Plan: MAC   Post-op Pain Management:    Induction:   PONV Risk Score and Plan: 3 and Treatment may vary due to age or medical condition, TIVA and Midazolam  Airway Management Planned:   Additional Equipment:   Intra-op Plan:   Post-operative Plan:   Informed Consent: I have reviewed the patients History and Physical, chart, labs and discussed the procedure including the risks, benefits and alternatives for the proposed anesthesia with the patient or authorized representative who has indicated his/her understanding and acceptance.     Dental Advisory Given  Plan Discussed with: CRNA  Anesthesia Plan Comments:         Anesthesia Quick Evaluation

## 2020-10-27 ENCOUNTER — Encounter: Payer: Self-pay | Admitting: Ophthalmology

## 2020-11-23 ENCOUNTER — Other Ambulatory Visit: Payer: Self-pay

## 2020-11-23 ENCOUNTER — Ambulatory Visit
Admission: RE | Admit: 2020-11-23 | Discharge: 2020-11-23 | Disposition: A | Discharge status: Home / Self Care | Payer: Medicare Other | Source: Ambulatory Visit | Attending: Internal Medicine | Admitting: Internal Medicine

## 2020-11-23 DIAGNOSIS — Z1231 Encounter for screening mammogram for malignant neoplasm of breast: Secondary | ICD-10-CM | POA: Diagnosis not present

## 2021-06-14 ENCOUNTER — Other Ambulatory Visit: Payer: Self-pay

## 2021-06-14 ENCOUNTER — Encounter: Payer: Self-pay | Admitting: Dermatology

## 2021-06-14 ENCOUNTER — Ambulatory Visit: Payer: Medicare Other | Admitting: Dermatology

## 2021-06-14 DIAGNOSIS — L570 Actinic keratosis: Secondary | ICD-10-CM

## 2021-06-14 DIAGNOSIS — L821 Other seborrheic keratosis: Secondary | ICD-10-CM | POA: Diagnosis not present

## 2021-06-14 DIAGNOSIS — Z85828 Personal history of other malignant neoplasm of skin: Secondary | ICD-10-CM | POA: Diagnosis not present

## 2021-06-14 DIAGNOSIS — L578 Other skin changes due to chronic exposure to nonionizing radiation: Secondary | ICD-10-CM

## 2021-06-14 DIAGNOSIS — L820 Inflamed seborrheic keratosis: Secondary | ICD-10-CM

## 2021-06-14 NOTE — Progress Notes (Signed)
   New Patient Visit  Subjective  Tracy Harvey is a 75 y.o. female who presents for the following: Skin Problem (New pt here to have spots check on her ears, neck and hands, pt report she think she has a history of skin cancer ).  The following portions of the chart were reviewed this encounter and updated as appropriate:   Tobacco  Allergies  Meds  Problems  Med Hx  Surg Hx  Fam Hx     Review of Systems:  No other skin or systemic complaints except as noted in HPI or Assessment and Plan.  Objective  Well appearing patient in no apparent distress; mood and affect are within normal limits.  A focused examination was performed including face,ears,hands. Relevant physical exam findings are noted in the Assessment and Plan.  Left Ear x 2, right ear x 2, right hand x 1 (5) Erythematous thin papules/macules with gritty scale.   left postauricular x 1 Erythematous keratotic or waxy stuck-on papule or plaque.   face Well healed scar with no evidence of recurrence.    Assessment & Plan  AK (actinic keratosis) (5) Left Ear x 2, right ear x 2, right hand x 1  Destruction of lesion - Left Ear x 2, right ear x 2, right hand x 1 Complexity: simple   Destruction method: cryotherapy   Informed consent: discussed and consent obtained   Timeout:  patient name, date of birth, surgical site, and procedure verified Lesion destroyed using liquid nitrogen: Yes   Region frozen until ice ball extended beyond lesion: Yes   Outcome: patient tolerated procedure well with no complications   Post-procedure details: wound care instructions given    Inflamed seborrheic keratosis left postauricular x 1  Destruction of lesion - left postauricular x 1 Complexity: simple   Destruction method: cryotherapy   Informed consent: discussed and consent obtained   Timeout:  patient name, date of birth, surgical site, and procedure verified Lesion destroyed using liquid nitrogen: Yes   Region frozen  until ice ball extended beyond lesion: Yes   Outcome: patient tolerated procedure well with no complications   Post-procedure details: wound care instructions given    History of basal cell carcinoma (BCC) face  Clear. Observe for recurrence. Call clinic for new or changing lesions.  Recommend regular skin exams, daily broad-spectrum spf 30+ sunscreen use, and photoprotection.     Actinic Damage - chronic, secondary to cumulative UV radiation exposure/sun exposure over time - diffuse scaly erythematous macules with underlying dyspigmentation - Recommend daily broad spectrum sunscreen SPF 30+ to sun-exposed areas, reapply every 2 hours as needed.  - Recommend staying in the shade or wearing long sleeves, sun glasses (UVA+UVB protection) and wide brim hats (4-inch brim around the entire circumference of the hat). - Call for new or changing lesions.   Seborrheic Keratoses - Stuck-on, waxy, tan-brown papules and/or plaques  - Benign-appearing - Discussed benign etiology and prognosis. - Observe - Call for any changes   Return in about 1 year (around 06/14/2022) for TBSE, Hx of Skin cancer .  IMarye Round, CMA, am acting as scribe for Sarina Ser, MD .  Documentation: I have reviewed the above documentation for accuracy and completeness, and I agree with the above.  Sarina Ser, MD

## 2021-06-14 NOTE — Patient Instructions (Addendum)

## 2021-06-19 ENCOUNTER — Encounter: Payer: Self-pay | Admitting: Dermatology

## 2021-10-22 ENCOUNTER — Other Ambulatory Visit: Payer: Self-pay | Admitting: Internal Medicine

## 2021-10-22 DIAGNOSIS — Z1231 Encounter for screening mammogram for malignant neoplasm of breast: Secondary | ICD-10-CM

## 2021-11-27 ENCOUNTER — Other Ambulatory Visit: Payer: Self-pay

## 2021-11-27 ENCOUNTER — Ambulatory Visit
Admission: RE | Admit: 2021-11-27 | Discharge: 2021-11-27 | Disposition: A | Discharge status: Home / Self Care | Payer: Medicare Other | Source: Ambulatory Visit | Attending: Internal Medicine | Admitting: Internal Medicine

## 2021-11-27 DIAGNOSIS — Z1231 Encounter for screening mammogram for malignant neoplasm of breast: Secondary | ICD-10-CM

## 2022-01-29 ENCOUNTER — Other Ambulatory Visit: Payer: Self-pay | Admitting: Internal Medicine

## 2022-01-29 ENCOUNTER — Other Ambulatory Visit (HOSPITAL_COMMUNITY): Payer: Self-pay | Admitting: Internal Medicine

## 2022-01-29 ENCOUNTER — Ambulatory Visit
Admission: RE | Admit: 2022-01-29 | Discharge: 2022-01-29 | Disposition: A | Discharge status: Home / Self Care | Payer: Medicare Other | Source: Ambulatory Visit | Attending: Internal Medicine | Admitting: Internal Medicine

## 2022-01-29 DIAGNOSIS — N179 Acute kidney failure, unspecified: Secondary | ICD-10-CM

## 2022-01-29 DIAGNOSIS — I5033 Acute on chronic diastolic (congestive) heart failure: Secondary | ICD-10-CM

## 2022-01-29 DIAGNOSIS — S7224XA Nondisplaced subtrochanteric fracture of right femur, initial encounter for closed fracture: Secondary | ICD-10-CM | POA: Diagnosis not present

## 2022-01-29 DIAGNOSIS — R600 Localized edema: Secondary | ICD-10-CM | POA: Insufficient documentation

## 2022-01-29 DIAGNOSIS — N320 Bladder-neck obstruction: Secondary | ICD-10-CM

## 2022-02-01 ENCOUNTER — Encounter
Admission: EM | Disposition: A | Discharge status: Skilled Nursing Facility | Payer: Self-pay | Source: Home / Self Care | Attending: Internal Medicine

## 2022-02-01 ENCOUNTER — Other Ambulatory Visit: Payer: Self-pay | Admitting: Internal Medicine

## 2022-02-01 ENCOUNTER — Emergency Department: Payer: Medicare Other

## 2022-02-01 ENCOUNTER — Inpatient Hospital Stay
Admission: EM | Admit: 2022-02-01 | Discharge: 2022-02-04 | DRG: 481 | Disposition: A | Discharge status: Skilled Nursing Facility | Payer: Medicare Other | Attending: Internal Medicine | Admitting: Internal Medicine

## 2022-02-01 ENCOUNTER — Other Ambulatory Visit: Payer: Self-pay

## 2022-02-01 ENCOUNTER — Ambulatory Visit
Admission: RE | Admit: 2022-02-01 | Discharge: 2022-02-01 | Disposition: A | Discharge status: Home / Self Care | Payer: Medicare Other | Source: Ambulatory Visit | Attending: Internal Medicine | Admitting: Internal Medicine

## 2022-02-01 ENCOUNTER — Inpatient Hospital Stay: Payer: Medicare Other | Admitting: Certified Registered Nurse Anesthetist

## 2022-02-01 ENCOUNTER — Inpatient Hospital Stay: Payer: Medicare Other

## 2022-02-01 DIAGNOSIS — Z803 Family history of malignant neoplasm of breast: Secondary | ICD-10-CM

## 2022-02-01 DIAGNOSIS — Z91012 Allergy to eggs: Secondary | ICD-10-CM | POA: Diagnosis not present

## 2022-02-01 DIAGNOSIS — Z7951 Long term (current) use of inhaled steroids: Secondary | ICD-10-CM | POA: Diagnosis not present

## 2022-02-01 DIAGNOSIS — W010XXA Fall on same level from slipping, tripping and stumbling without subsequent striking against object, initial encounter: Secondary | ICD-10-CM | POA: Diagnosis present

## 2022-02-01 DIAGNOSIS — Z880 Allergy status to penicillin: Secondary | ICD-10-CM

## 2022-02-01 DIAGNOSIS — D72829 Elevated white blood cell count, unspecified: Secondary | ICD-10-CM | POA: Diagnosis present

## 2022-02-01 DIAGNOSIS — E876 Hypokalemia: Secondary | ICD-10-CM | POA: Diagnosis present

## 2022-02-01 DIAGNOSIS — Y92009 Unspecified place in unspecified non-institutional (private) residence as the place of occurrence of the external cause: Secondary | ICD-10-CM | POA: Diagnosis not present

## 2022-02-01 DIAGNOSIS — I10 Essential (primary) hypertension: Secondary | ICD-10-CM | POA: Diagnosis present

## 2022-02-01 DIAGNOSIS — Y929 Unspecified place or not applicable: Secondary | ICD-10-CM | POA: Diagnosis not present

## 2022-02-01 DIAGNOSIS — E871 Hypo-osmolality and hyponatremia: Secondary | ICD-10-CM | POA: Diagnosis present

## 2022-02-01 DIAGNOSIS — K219 Gastro-esophageal reflux disease without esophagitis: Secondary | ICD-10-CM | POA: Diagnosis present

## 2022-02-01 DIAGNOSIS — Z961 Presence of intraocular lens: Secondary | ICD-10-CM | POA: Diagnosis present

## 2022-02-01 DIAGNOSIS — Z888 Allergy status to other drugs, medicaments and biological substances status: Secondary | ICD-10-CM | POA: Diagnosis not present

## 2022-02-01 DIAGNOSIS — Z85828 Personal history of other malignant neoplasm of skin: Secondary | ICD-10-CM | POA: Diagnosis not present

## 2022-02-01 DIAGNOSIS — M25551 Pain in right hip: Secondary | ICD-10-CM

## 2022-02-01 DIAGNOSIS — M1611 Unilateral primary osteoarthritis, right hip: Secondary | ICD-10-CM | POA: Diagnosis present

## 2022-02-01 DIAGNOSIS — S7224XA Nondisplaced subtrochanteric fracture of right femur, initial encounter for closed fracture: Principal | ICD-10-CM

## 2022-02-01 DIAGNOSIS — Z20822 Contact with and (suspected) exposure to covid-19: Secondary | ICD-10-CM | POA: Diagnosis present

## 2022-02-01 DIAGNOSIS — Z79899 Other long term (current) drug therapy: Secondary | ICD-10-CM

## 2022-02-01 DIAGNOSIS — Z885 Allergy status to narcotic agent status: Secondary | ICD-10-CM

## 2022-02-01 DIAGNOSIS — Z9842 Cataract extraction status, left eye: Secondary | ICD-10-CM | POA: Diagnosis not present

## 2022-02-01 DIAGNOSIS — T502X5A Adverse effect of carbonic-anhydrase inhibitors, benzothiadiazides and other diuretics, initial encounter: Secondary | ICD-10-CM | POA: Diagnosis present

## 2022-02-01 DIAGNOSIS — Z7982 Long term (current) use of aspirin: Secondary | ICD-10-CM

## 2022-02-01 DIAGNOSIS — Z9841 Cataract extraction status, right eye: Secondary | ICD-10-CM

## 2022-02-01 HISTORY — PX: INTRAMEDULLARY (IM) NAIL INTERTROCHANTERIC: SHX5875

## 2022-02-01 LAB — COMPREHENSIVE METABOLIC PANEL
ALT: 34 U/L (ref 0–44)
AST: 40 U/L (ref 15–41)
Albumin: 3.8 g/dL (ref 3.5–5.0)
Alkaline Phosphatase: 57 U/L (ref 38–126)
Anion gap: 9 (ref 5–15)
BUN: 24 mg/dL — ABNORMAL HIGH (ref 8–23)
CO2: 22 mmol/L (ref 22–32)
Calcium: 9.1 mg/dL (ref 8.9–10.3)
Chloride: 97 mmol/L — ABNORMAL LOW (ref 98–111)
Creatinine, Ser: 1.04 mg/dL — ABNORMAL HIGH (ref 0.44–1.00)
GFR, Estimated: 55 mL/min — ABNORMAL LOW (ref >60)
Glucose, Bld: 107 mg/dL — ABNORMAL HIGH (ref 70–99)
Potassium: 3.7 mmol/L (ref 3.5–5.1)
Sodium: 128 mmol/L — ABNORMAL LOW (ref 135–145)
Total Bilirubin: 0.8 mg/dL (ref 0.3–1.2)
Total Protein: 7.3 g/dL (ref 6.5–8.1)

## 2022-02-01 LAB — TYPE AND SCREEN
ABO/RH(D): A POS
Antibody Screen: NEGATIVE

## 2022-02-01 LAB — CBC
HCT: 39.1 % (ref 36.0–46.0)
Hemoglobin: 13 g/dL (ref 12.0–15.0)
MCH: 30.9 pg (ref 26.0–34.0)
MCHC: 33.2 g/dL (ref 30.0–36.0)
MCV: 92.9 fL (ref 80.0–100.0)
Platelets: 359 10*3/uL (ref 150–400)
RBC: 4.21 MIL/uL (ref 3.87–5.11)
RDW: 13.9 % (ref 11.5–15.5)
WBC: 16.4 10*3/uL — ABNORMAL HIGH (ref 4.0–10.5)
nRBC: 0 % (ref 0.0–0.2)

## 2022-02-01 SURGERY — FIXATION, FRACTURE, INTERTROCHANTERIC, WITH INTRAMEDULLARY ROD
Anesthesia: Spinal | Site: Hip | Laterality: Right

## 2022-02-01 MED ORDER — PROPOFOL 10 MG/ML IV BOLUS
INTRAVENOUS | Status: DC | PRN
  Administered 2022-02-01: 20 mg via INTRAVENOUS

## 2022-02-01 MED ORDER — METHOCARBAMOL 500 MG PO TABS
500.0000 mg | ORAL_TABLET | Freq: Four times a day (QID) | ORAL | Status: DC | PRN
  Administered 2022-02-02 – 2022-02-04 (×6): 500 mg via ORAL
  Filled 2022-02-01 (×7): qty 1

## 2022-02-01 MED ORDER — DOCUSATE SODIUM 100 MG PO CAPS
100.0000 mg | ORAL_CAPSULE | Freq: Two times a day (BID) | ORAL | Status: DC
  Administered 2022-02-01 – 2022-02-04 (×6): 100 mg via ORAL
  Filled 2022-02-01 (×6): qty 1

## 2022-02-01 MED ORDER — ENOXAPARIN SODIUM 40 MG/0.4ML IJ SOSY
40.0000 mg | PREFILLED_SYRINGE | INTRAMUSCULAR | Status: DC
  Administered 2022-02-02 – 2022-02-04 (×3): 40 mg via SUBCUTANEOUS
  Filled 2022-02-01 (×3): qty 0.4

## 2022-02-01 MED ORDER — PANTOPRAZOLE SODIUM 40 MG PO TBEC
40.0000 mg | DELAYED_RELEASE_TABLET | Freq: Every day | ORAL | Status: DC
  Administered 2022-02-02 – 2022-02-04 (×3): 40 mg via ORAL
  Filled 2022-02-01 (×3): qty 1

## 2022-02-01 MED ORDER — ACETAMINOPHEN 500 MG PO TABS
500.0000 mg | ORAL_TABLET | Freq: Four times a day (QID) | ORAL | Status: AC
  Administered 2022-02-01 – 2022-02-02 (×4): 500 mg via ORAL
  Filled 2022-02-01 (×4): qty 1

## 2022-02-01 MED ORDER — CEFAZOLIN SODIUM-DEXTROSE 2-4 GM/100ML-% IV SOLN
2.0000 g | Freq: Once | INTRAVENOUS | Status: AC
  Administered 2022-02-01: 2 g via INTRAVENOUS

## 2022-02-01 MED ORDER — MAGNESIUM HYDROXIDE 400 MG/5ML PO SUSP
30.0000 mL | Freq: Every day | ORAL | Status: DC | PRN
  Administered 2022-02-03: 30 mL via ORAL
  Filled 2022-02-01: qty 30

## 2022-02-01 MED ORDER — SODIUM CHLORIDE 0.9 % IV BOLUS: 500.0000 mL | Freq: Once | INTRAVENOUS | Status: DC

## 2022-02-01 MED ORDER — PHENYLEPHRINE 40 MCG/ML (10ML) SYRINGE FOR IV PUSH (FOR BLOOD PRESSURE SUPPORT)
PREFILLED_SYRINGE | INTRAVENOUS | Status: DC | PRN
  Administered 2022-02-01 (×2): 80 ug via INTRAVENOUS

## 2022-02-01 MED ORDER — SODIUM CHLORIDE 0.9 % IV SOLN: INTRAVENOUS | Status: AC

## 2022-02-01 MED ORDER — BISACODYL 10 MG RE SUPP
10.0000 mg | Freq: Every day | RECTAL | Status: DC | PRN
Start: 2022-02-01 — End: 2022-02-04

## 2022-02-01 MED ORDER — MOMETASONE FURO-FORMOTEROL FUM 200-5 MCG/ACT IN AERO
2.0000 | INHALATION_SPRAY | Freq: Two times a day (BID) | RESPIRATORY_TRACT | Status: DC
  Administered 2022-02-02 – 2022-02-04 (×5): 2 via RESPIRATORY_TRACT
  Filled 2022-02-01 (×2): qty 8.8

## 2022-02-01 MED ORDER — METHOCARBAMOL 1000 MG/10ML IJ SOLN
500.0000 mg | Freq: Four times a day (QID) | INTRAVENOUS | Status: DC | PRN
  Filled 2022-02-01: qty 5

## 2022-02-01 MED ORDER — DIPHENHYDRAMINE HCL 12.5 MG/5ML PO ELIX: 12.5000 mg | ORAL_SOLUTION | ORAL | Status: DC | PRN

## 2022-02-01 MED ORDER — BUPIVACAINE HCL (PF) 0.5 % IJ SOLN
INTRAMUSCULAR | Status: DC | PRN
Start: 2022-02-01 — End: 2022-02-01
  Administered 2022-02-01: 2.3 mL

## 2022-02-01 MED ORDER — FENTANYL CITRATE (PF) 100 MCG/2ML IJ SOLN
INTRAMUSCULAR | Status: DC | PRN
  Administered 2022-02-01: 50 ug via INTRAVENOUS

## 2022-02-01 MED ORDER — MONTELUKAST SODIUM 10 MG PO TABS
10.0000 mg | ORAL_TABLET | Freq: Every day | ORAL | Status: DC
  Administered 2022-02-01 – 2022-02-03 (×3): 10 mg via ORAL
  Filled 2022-02-01 (×3): qty 1

## 2022-02-01 MED ORDER — SENNA 8.6 MG PO TABS
1.0000 | ORAL_TABLET | Freq: Two times a day (BID) | ORAL | Status: DC
  Administered 2022-02-01 – 2022-02-04 (×6): 8.6 mg via ORAL
  Filled 2022-02-01 (×6): qty 1

## 2022-02-01 MED ORDER — CEFAZOLIN SODIUM-DEXTROSE 2-4 GM/100ML-% IV SOLN
2.0000 g | Freq: Four times a day (QID) | INTRAVENOUS | Status: AC
  Administered 2022-02-01 – 2022-02-02 (×2): 2 g via INTRAVENOUS
  Filled 2022-02-01 (×3): qty 100

## 2022-02-01 MED ORDER — SODIUM CHLORIDE FLUSH 0.9 % IV SOLN
INTRAVENOUS | Status: AC
Start: 2022-02-01 — End: 2022-02-02
  Filled 2022-02-01: qty 10

## 2022-02-01 MED ORDER — ONDANSETRON HCL 4 MG/2ML IJ SOLN: 4.0000 mg | Freq: Four times a day (QID) | INTRAMUSCULAR | Status: DC | PRN

## 2022-02-01 MED ORDER — BUPIVACAINE-EPINEPHRINE (PF) 0.5% -1:200000 IJ SOLN
INTRAMUSCULAR | Status: DC | PRN
  Administered 2022-02-01: 30 mL via PERINEURAL

## 2022-02-01 MED ORDER — AMLODIPINE BESYLATE 5 MG PO TABS
5.0000 mg | ORAL_TABLET | Freq: Every day | ORAL | Status: DC
  Administered 2022-02-02 – 2022-02-04 (×3): 5 mg via ORAL
  Filled 2022-02-01 (×3): qty 1

## 2022-02-01 MED ORDER — APREPITANT 40 MG PO CAPS
40.0000 mg | ORAL_CAPSULE | Freq: Once | ORAL | Status: AC
  Administered 2022-02-01: 40 mg via ORAL

## 2022-02-01 MED ORDER — LIDOCAINE HCL (CARDIAC) PF 100 MG/5ML IV SOSY
PREFILLED_SYRINGE | INTRAVENOUS | Status: DC | PRN
Start: 2022-02-01 — End: 2022-02-01

## 2022-02-01 MED ORDER — OYSTER SHELL CALCIUM/D3 500-5 MG-MCG PO TABS
1.0000 | ORAL_TABLET | Freq: Three times a day (TID) | ORAL | Status: DC
  Administered 2022-02-01 – 2022-02-04 (×8): 1 via ORAL
  Filled 2022-02-01 (×8): qty 1

## 2022-02-01 MED ORDER — ACETAMINOPHEN 325 MG PO TABS: 325.0000 mg | ORAL_TABLET | Freq: Four times a day (QID) | ORAL | Status: DC | PRN

## 2022-02-01 MED ORDER — ONDANSETRON HCL 4 MG PO TABS
4.0000 mg | ORAL_TABLET | Freq: Four times a day (QID) | ORAL | Status: DC | PRN
  Administered 2022-02-01 – 2022-02-02 (×3): 4 mg via ORAL
  Filled 2022-02-01 (×3): qty 1

## 2022-02-01 MED ORDER — MIDAZOLAM HCL 2 MG/2ML IJ SOLN
INTRAMUSCULAR | Status: DC | PRN
  Administered 2022-02-01 (×2): 1 mg via INTRAVENOUS

## 2022-02-01 MED ORDER — ASCORBIC ACID 500 MG PO TABS
500.0000 mg | ORAL_TABLET | Freq: Every day | ORAL | Status: DC
  Administered 2022-02-02 – 2022-02-04 (×3): 500 mg via ORAL
  Filled 2022-02-01 (×3): qty 1

## 2022-02-01 MED ORDER — VERAPAMIL HCL 120 MG PO TABS
60.0000 mg | ORAL_TABLET | Freq: Two times a day (BID) | ORAL | Status: DC
  Administered 2022-02-02 – 2022-02-04 (×5): 60 mg via ORAL
  Filled 2022-02-01 (×6): qty 0.5

## 2022-02-01 MED ORDER — ALBUTEROL SULFATE (2.5 MG/3ML) 0.083% IN NEBU: 3.0000 mL | INHALATION_SOLUTION | Freq: Four times a day (QID) | RESPIRATORY_TRACT | Status: DC | PRN

## 2022-02-01 MED ORDER — ADULT MULTIVITAMIN W/MINERALS CH
1.0000 | ORAL_TABLET | Freq: Every day | ORAL | Status: DC
  Administered 2022-02-01 – 2022-02-04 (×4): 1 via ORAL
  Filled 2022-02-01 (×4): qty 1

## 2022-02-01 MED ORDER — METOCLOPRAMIDE HCL 5 MG/ML IJ SOLN: 5.0000 mg | Freq: Three times a day (TID) | INTRAMUSCULAR | Status: DC | PRN

## 2022-02-01 MED ORDER — ACETAMINOPHEN 500 MG PO TABS: 1000.0000 mg | ORAL_TABLET | Freq: Four times a day (QID) | ORAL | Status: DC | PRN

## 2022-02-01 MED ORDER — PROPOFOL 500 MG/50ML IV EMUL
INTRAVENOUS | Status: DC | PRN
  Administered 2022-02-01: 50 ug/kg/min via INTRAVENOUS

## 2022-02-01 MED ORDER — HYDROCODONE-ACETAMINOPHEN 5-325 MG PO TABS
1.0000 | ORAL_TABLET | Freq: Four times a day (QID) | ORAL | Status: DC | PRN
  Administered 2022-02-01: 2 via ORAL
  Administered 2022-02-02: 1 via ORAL
  Administered 2022-02-02 – 2022-02-03 (×3): 2 via ORAL
  Administered 2022-02-04: 1 via ORAL
  Administered 2022-02-04: 2 via ORAL
  Administered 2022-02-04: 1 via ORAL
  Filled 2022-02-01 (×3): qty 2
  Filled 2022-02-01 (×2): qty 1
  Filled 2022-02-01 (×3): qty 2

## 2022-02-01 MED ORDER — MORPHINE SULFATE (PF) 2 MG/ML IV SOLN: 0.5000 mg | INTRAVENOUS | Status: DC | PRN

## 2022-02-01 MED ORDER — FENTANYL CITRATE (PF) 100 MCG/2ML IJ SOLN
INTRAMUSCULAR | Status: AC
  Filled 2022-02-01: qty 2

## 2022-02-01 MED ORDER — METOCLOPRAMIDE HCL 10 MG PO TABS: 5.0000 mg | ORAL_TABLET | Freq: Three times a day (TID) | ORAL | Status: DC | PRN

## 2022-02-01 MED ORDER — PHENYLEPHRINE HCL-NACL 20-0.9 MG/250ML-% IV SOLN
INTRAVENOUS | Status: DC | PRN
  Administered 2022-02-01: 40 ug/min via INTRAVENOUS

## 2022-02-01 MED ORDER — MIDAZOLAM HCL 2 MG/2ML IJ SOLN
INTRAMUSCULAR | Status: AC
  Filled 2022-02-01: qty 2

## 2022-02-01 MED ORDER — APREPITANT 40 MG PO CAPS
ORAL_CAPSULE | ORAL | Status: AC
  Filled 2022-02-01: qty 1

## 2022-02-01 MED ORDER — ESTRADIOL 1 MG PO TABS
1.0000 mg | ORAL_TABLET | Freq: Every day | ORAL | Status: DC
Start: 2022-02-02 — End: 2022-02-04
  Administered 2022-02-02 – 2022-02-04 (×3): 1 mg via ORAL
  Filled 2022-02-01 (×3): qty 1

## 2022-02-01 MED ORDER — 0.9 % SODIUM CHLORIDE (POUR BTL) OPTIME
TOPICAL | Status: DC | PRN
  Administered 2022-02-01: 500 mL

## 2022-02-01 MED ORDER — DILTIAZEM HCL ER BEADS 180 MG PO CP24: 180.0000 mg | ORAL_CAPSULE | Freq: Every day | ORAL | Status: DC

## 2022-02-01 MED ORDER — BUPIVACAINE-EPINEPHRINE (PF) 0.5% -1:200000 IJ SOLN
INTRAMUSCULAR | Status: AC
  Filled 2022-02-01: qty 30

## 2022-02-01 MED ORDER — CEFAZOLIN SODIUM-DEXTROSE 2-4 GM/100ML-% IV SOLN
INTRAVENOUS | Status: AC
  Administered 2022-02-02: 2 g via INTRAVENOUS
  Filled 2022-02-01: qty 100

## 2022-02-01 MED ORDER — FLEET ENEMA 7-19 GM/118ML RE ENEM: 1.0000 | ENEMA | Freq: Once | RECTAL | Status: DC | PRN

## 2022-02-01 SURGICAL SUPPLY — 46 items
BIT DRILL CROWE PNT TWST 4.5MM (DRILL) IMPLANT
BNDG COHESIVE 4X5 TAN ST LF (GAUZE/BANDAGES/DRESSINGS) ×2 IMPLANT
BNDG COHESIVE 6X5 TAN ST LF (GAUZE/BANDAGES/DRESSINGS) ×2 IMPLANT
CHLORAPREP W/TINT 26 (MISCELLANEOUS) ×4 IMPLANT
DRAPE 3/4 80X56 (DRAPES) ×2 IMPLANT
DRAPE C-ARMOR (DRAPES) ×2 IMPLANT
DRILL CROWE POINT TWIST 4.5MM (DRILL) ×2
DRSG MEPILEX SACRM 8.7X9.8 (GAUZE/BANDAGES/DRESSINGS) ×2 IMPLANT
DRSG OPSITE POSTOP 3X4 (GAUZE/BANDAGES/DRESSINGS) ×3 IMPLANT
DRSG OPSITE POSTOP 4X6 (GAUZE/BANDAGES/DRESSINGS) IMPLANT
ELECT CAUTERY BLADE 6.4 (BLADE) ×2 IMPLANT
ELECT REM PT RETURN 9FT ADLT (ELECTROSURGICAL) ×2
ELECTRODE REM PT RTRN 9FT ADLT (ELECTROSURGICAL) ×1 IMPLANT
GAUZE SPONGE 4X4 12PLY STRL (GAUZE/BANDAGES/DRESSINGS) ×2 IMPLANT
GLOVE SURG ENC MOIS LTX SZ8 (GLOVE) ×4 IMPLANT
GLOVE SURG UNDER LTX SZ8 (GLOVE) ×2 IMPLANT
GOWN STRL REUS W/ TWL LRG LVL3 (GOWN DISPOSABLE) ×1 IMPLANT
GOWN STRL REUS W/ TWL XL LVL3 (GOWN DISPOSABLE) ×1 IMPLANT
GOWN STRL REUS W/TWL LRG LVL3 (GOWN DISPOSABLE) ×1
GOWN STRL REUS W/TWL XL LVL3 (GOWN DISPOSABLE) ×1
GUIDEPIN VERSANAIL DSP 3.2X444 (ORTHOPEDIC DISPOSABLE SUPPLIES) ×1 IMPLANT
GUIDEWIRE NATURAL NAIL 3X100 (WIRE) ×1 IMPLANT
HFN RH 130 DEG 9MM X 340MM (Nail) ×1 IMPLANT
HIP FRA NAIL LAG SCREW 10.5X90 (Orthopedic Implant) ×2 IMPLANT
MANIFOLD NEPTUNE II (INSTRUMENTS) ×2 IMPLANT
MAT ABSORB  FLUID 56X50 GRAY (MISCELLANEOUS) ×1
MAT ABSORB FLUID 56X50 GRAY (MISCELLANEOUS) ×1 IMPLANT
NDL FILTER BLUNT 18X1 1/2 (NEEDLE) ×1 IMPLANT
NEEDLE FILTER BLUNT 18X 1/2SAF (NEEDLE) ×1
NEEDLE FILTER BLUNT 18X1 1/2 (NEEDLE) ×1 IMPLANT
NEEDLE HYPO 22GX1.5 SAFETY (NEEDLE) ×2 IMPLANT
NS IRRIG 500ML POUR BTL (IV SOLUTION) ×2 IMPLANT
PACK HIP COMPR (MISCELLANEOUS) ×2 IMPLANT
SCREW BONE CORTICAL 5.0X36 (Screw) ×1 IMPLANT
SCREW BONE CORTICAL 5.0X40 (Screw) ×1 IMPLANT
SCREW BONE CORTICAL 5.0X42 (Screw) ×1 IMPLANT
SCREW LAG HIP FRA NAIL 10.5X90 (Orthopedic Implant) IMPLANT
STAPLER SKIN PROX 35W (STAPLE) ×2 IMPLANT
STRAP SAFETY 5IN WIDE (MISCELLANEOUS) ×2 IMPLANT
SUT VIC AB 0 CT1 36 (SUTURE) ×2 IMPLANT
SUT VIC AB 1 CT1 36 (SUTURE) ×2 IMPLANT
SUT VIC AB 2-0 CT1 (SUTURE) ×4 IMPLANT
SYR 10ML LL (SYRINGE) ×2 IMPLANT
SYR 30ML LL (SYRINGE) ×2 IMPLANT
TAPE MICROFOAM 4IN (TAPE) ×2 IMPLANT
WATER STERILE IRR 500ML POUR (IV SOLUTION) ×2 IMPLANT

## 2022-02-01 NOTE — ED Notes (Signed)
Pt to same day surgery with tech ?

## 2022-02-01 NOTE — Assessment & Plan Note (Addendum)
Continue pantoprazole. °

## 2022-02-01 NOTE — Op Note (Signed)
02/01/2022 ? ?6:56 PM ? ?Patient:   Tracy Harvey ? ?Pre-Op Diagnosis:   Closed nondisplaced subtrochanteric fracture, right hip. ? ?Post-Op Diagnosis:   Same ? ?Procedure:   Reduction and internal fixation of nondisplaced subtrochanteric right hip fracture with Biomet Affixis TFN nail. ? ?Surgeon:   Pascal Lux, MD ? ?Assistant:   None ? ?Anesthesia:   Spinal ? ?Findings:   As above ? ?Complications:   None ? ?EBL:   50 cc ? ?Fluids:   900 cc crystalloid ? ?UOP:   None ? ?TT:   None ? ?Drains:   None ? ?Closure:   Staples ? ?Implants:   Biomet Affixis 9 x 340 mm TFN with a 90 mm lag screw and 36 mm and 40 mm distal interlocking screws ? ?Brief Clinical Note:   The patient is a 76 year old female with a 5 to 6-week history of right hip pain following a fall. Initial x-rays were negative so she was treated for trochanteric bursitis with a steroid injection and physical therapy. Despite these treatments, she has continued to have pain. A CT scan was obtained which demonstrated the presence of a nondisplaced incomplete subtrochanteric fracture of the right hip. The patient has been cleared medically and presents at this time for reduction and internal fixation of the nondisplaced subtrochanteric right hip fracture. ? ?Procedure:   The patient was brought into the operating room. After adequate spinal anesthesia was obtained, the patient was lain in the supine position on the fracture table. The uninjured leg was placed in a flexed and abducted position while the injured lower extremity was placed in gentle longitudinal traction with internal rotation. The adequacy of proximal femoral position was verified fluoroscopically in AP and lateral projections and found to be excellent. The lateral aspects of the right hip and thigh were prepped with ChloraPrep solution before being draped sterilely. Preoperative antibiotics were administered. A timeout was performed to verify the appropriate surgical site.  ? ?The  greater trochanter was identified fluoroscopically and an approximately 3 cm incision made about 2-3 fingerbreadths above the tip of the greater trochanter. The incision was carried down through the subcutaneous tissues to expose the gluteal fascia. This was split the length of the incision, providing access to the tip of the trochanter. Under fluoroscopic guidance, a guidewire was drilled through the tip of the trochanter into the proximal metaphysis to the level of the lesser trochanter. After verifying its position fluoroscopically in AP and lateral projections, it was overreamed with the initial reamer to the depth of the lesser trochanter. A guidewire was passed down through the femoral canal to the supracondylar region. The adequacy of guidewire position was verified fluoroscopically in AP and lateral projections before the length of the guidewire within the canal was measured and found to be 355 mm. Therefore, a 340 mm length nail was selected. The guidewire was overreamed sequentially using the flexible reamers, beginning with a 10 mm reamer and progressing to a 10.5 mm reamer. This provided good cortical chatter. The 9 x 340 mm Biomet Affixis TFN rod was selected and advanced to the appropriate depth, as verified fluoroscopically.  ? ?The guide system for the lag screw was positioned and advanced through an approximately 2 cm stab incision over the lateral aspect of the proximal femur. The guidewire was drilled up through the trochanteric femoral nail and into the femoral neck to rest within 5 mm of subchondral bone. After verifying its position in the femoral neck and head in both  AP and lateral projections, the guidewire was measured and found to be optimally replicated by a 90 mm lag screw. The guidewire was overreamed to the appropriate depth before the lag screw was inserted and advanced to the appropriate depth as verified fluoroscopically in AP and lateral projections. The locking screw was advanced,  then backed off a quarter turn to set the lag screw. Again the adequacy of hardware position and fracture reduction was verified fluoroscopically in AP and lateral projections and found to be excellent. ? ?Attention was directed distally. Using the "perfect circle" technique, the leg and fluoroscopy machine were positioned appropriately. An approximately 2.5 cm stab incision was made over the skin at the appropriate point before the drill bit was advanced through the cortex and across the static hole of the nail. The appropriate length of the screw was determined before the 42 mm distal interlocking screw was positioned, then advanced and tightened securely. Given that this was a subtrochanteric fracture, it was elected to place a second interlocking screw distally. Again, the drill bit was advanced through the cortex and across the dynamic hole of the nail. The appropriate length of the screw was determined before the 40 mm distal interlocking screw was positioned, then advanced and tightened securely. The adequacy of screw position was verified fluoroscopically in AP and lateral projections.  The more proximal screw was felt to be too long, so a shorter screw measuring 36 mm was placed.  Subsequent fluoroscopic imaging in AP and lateral projections demonstrated excellent position of the screws. ? ?The wounds were irrigated thoroughly with sterile saline solution before the abductor fascia was reapproximated using #0 Vicryl interrupted sutures. The subcutaneous tissues were closed using 2-0 Vicryl interrupted sutures. The skin was closed using staples. A total of 30 cc of 0.5% Sensorcaine with epinephrine was injected in and around all incisions. Sterile occlusive dressings were applied to all wounds before the patient was transferred back to his/her hospital bed. The patient was then returned to the recovery room in satisfactory condition after tolerating the procedure well. ?

## 2022-02-01 NOTE — Consult Note (Signed)
ORTHOPAEDIC CONSULTATION ? ?REQUESTING PHYSICIAN: Collier Bullock, MD ? ?Chief Complaint:   ?Right hip pain. ? ?History of Present Illness: ?Tracy Harvey is a 76 y.o. female with multiple medical problems including hypertension, gastroesophageal reflux disease, history of migraines, skin cancer, arthritis, and asthma who lives independently.  The patient apparently fell in her home nearly 6 weeks ago.  She presented to her primary care provider who had treated her in the past for trochanteric bursitis, he administered a bursal injection which provided little if any relief of her symptoms.  X-rays at that time were unremarkable.  She was seen by orthopedics about 10 days ago who diagnosed her with trochanteric bursitis versus lumbar radiculopathy.  The patient was started in physical therapy and given a prescription for Celebrex.  The patient continues to complain of pain, so that she was sent for a CT scan of the hip which confirmed the presence of a nondisplaced fracture of the subtrochanteric region of her right hip.  She was sent to the emergency room and admitted to the hospital service and preparation for definitive management of this injury. ? ?Past Medical History:  ?Diagnosis Date  ? Arthritis   ? Asthma   ? Basal cell carcinoma   ? face  ? Cancer [facility])   ? SKIN CANCER-BASAL CELL  ? Complication of anesthesia   ? Dysrhythmia   ? PACs, SVT  ? Foot fracture, left   ? WEARING BRACE  ? GERD (gastroesophageal reflux disease)   ? Headache   ? H/O MIGRAINES  ? History of hiatal hernia   ? Hypertension   ? IBS (irritable bowel syndrome)   ? PONV (postoperative nausea and vomiting)   ? Shortness of breath dyspnea   ? Wheezing   ? OCC  ? ?Past Surgical History:  ?Procedure Laterality Date  ? ABDOMINAL HYSTERECTOMY    ? BONE EXCISION Left 04/27/2020  ? Procedure: PARTIAL EXCISION BONE-PHALANX;  Surgeon: Albertine Patricia, DPM;  Location: [place];  Service: Podiatry;  Laterality: Left;  IVA LOCAL  ? CATARACT EXTRACTION W/PHACO Right 02/05/2016  ? Procedure: CATARACT EXTRACTION PHACO AND INTRAOCULAR LENS PLACEMENT (IOC);  Surgeon: Estill Cotta, MD;  Location: [facility];  Service: Ophthalmology;  Laterality: Right;  [place] 01:02 ?AP% 23.9 ?CDE 25.0 ?fluid pack lot # [id number]  ? CATARACT EXTRACTION W/PHACO Left 10/26/2020  ? Procedure: CATARACT EXTRACTION PHACO AND INTRAOCULAR LENS PLACEMENT (IOC) LEFT 6.73 00:37.8;  Surgeon: Birder Robson, MD;  Location: [place];  Service: Ophthalmology;  Laterality: Left;  ? colonoscopy with polypectomy    ? COLONOSCOPY WITH PROPOFOL N/A 02/03/2019  ? Procedure: COLONOSCOPY WITH PROPOFOL;  Surgeon: Manya Silvas, MD;  Location: [facility];  Service: Endoscopy;  Laterality: N/A;  ? ESOPHAGOGASTRODUODENOSCOPY    ? FOOT SURGERY    ? HAND SURGERY    ? MOUTH SURGERY    ? NASAL SINUS SURGERY    ? ORIF ELBOW FRACTURE Left 11/26/2019  ? Procedure: OPEN REDUCTION INTERNAL FIXATION (ORIF) ELBOW/OLECRANON FRACTURE;  Surgeon: Dereck Leep, MD;  Location: [facility];  Service: Orthopedics;  Laterality: Left;  ? TONSILLECTOMY    ? ?Social History  ? ?Socioeconomic History  ? Marital status: Widowed  ?  Spouse name: Not on file  ? Number of children: Not on file  ? Years of education: Not on file  ? Highest education level: Not on file  ?Occupational History  ? Not on file  ?Tobacco Use  ? Smoking status: Never  ?  Smokeless tobacco: Never  ?Vaping Use  ? Vaping Use: Never used  ?Substance and Sexual Activity  ? Alcohol use: No  ? Drug use: Never  ? Sexual activity: Not on file  ?Other Topics Concern  ? Not on file  ?Social History Narrative  ? Not on file  ? ?Social Determinants of Health  ? ?Financial Resource Strain: Not on file  ?Food Insecurity: Not on file  ?Transportation Needs: Not on file  ?Physical Activity: Not on file  ?Stress: Not on file  ?Social Connections: Not on file  ? ?Family History  ?Problem  Relation Age of Onset  ? Breast cancer Other   ? ?Allergies  ?Allergen Reactions  ? Augmentin [Amoxicillin-Pot Clavulanate] Nausea And Vomiting  ?  Did it involve swelling of the face/tongue/throat, SOB, or low BP? No ?Did it involve sudden or severe rash/hives, skin peeling, or any reaction on the inside of your mouth or nose? No ?Did you need to seek medical attention at a hospital or doctor's office? No ?When did it last happen? More than 20 years ago     ?If all above answers are ?NO?, may proceed with cephalosporin use. ?  ? Chocolate Flavor Other (See Comments)  ?  Headaches.  ? Egg [Eggs Or Egg-Derived Products] Nausea And Vomiting and Other (See Comments)  ?  Severe migraines.  ? Eryc [Erythromycin] Other (See Comments)  ?  Unknown reaction  ? Morphine And Related Nausea And Vomiting  ?  Vomiting for days.  ? Tilactase Other (See Comments)  ?  Unknown reaction.  ? ?Prior to Admission medications   ?Medication Sig Start Date End Date Taking? Authorizing Provider  ?acetaminophen (TYLENOL) 500 MG tablet Take 1,000 mg by mouth every 6 (six) hours as needed (for pain.).    Yes [provider]  ?albuterol (PROVENTIL HFA;VENTOLIN HFA) 108 (90 Base) MCG/ACT inhaler Inhale 2 puffs into the lungs every 6 (six) hours as needed for wheezing or shortness of breath.   Yes [provider]  ?amLODipine (NORVASC) 5 MG tablet Take 2.5 mg by mouth every evening.    Yes [provider]  ?Ascorbic Acid (VITAMIN C) 100 MG tablet Take 500 mg by mouth daily.   Yes [provider]  ?aspirin 81 MG chewable tablet Chew 81 mg by mouth daily.   Yes [provider]  ?BIOTIN PO Take by mouth.   Yes [provider]  ?budesonide-formoterol (SYMBICORT) 160-4.5 MCG/ACT inhaler Inhale 2 puffs into the lungs 2 (two) times daily.   Yes [provider]  ?Calcium Carb-Cholecalciferol (CALCIUM 600-D PO) Take 1 tablet by mouth 3 (three) times daily.   Yes [provider]   ?Cholecalciferol (VITAMIN D3) 50 MCG (2000 UT) TABS Take 2,000 Units by mouth daily after breakfast.   Yes [provider]  ?diltiazem (TIAZAC) 180 MG 24 hr capsule Take 180 mg by mouth at bedtime.   Yes [provider]  ?estradiol (ESTRACE) 1 MG tablet Take 1 mg by mouth daily after breakfast.    Yes [provider]  ?ferrous gluconate (FERGON) 240 (27 FE) MG tablet Take 240 mg by mouth once a week.   Yes [provider]  ?fexofenadine (ALLEGRA) 180 MG tablet Take 90 mg by mouth daily.   Yes [provider]  ?fluticasone (FLONASE) 50 MCG/ACT nasal spray Place 2 sprays into both nostrils daily after breakfast.   Yes [provider]  ?losartan-hydrochlorothiazide (HYZAAR) 100-12.5 MG tablet Take 1 tablet by mouth daily  after breakfast.    Yes [provider]  ?Magnesium 500 MG TABS Take 250 mg by mouth daily after breakfast. Hold for diarrhea   Yes [provider]  ?montelukast (SINGULAIR) 10 MG tablet Take 10 mg by mouth at bedtime.   Yes [provider]  ?Multiple Vitamin (MULTIVITAMIN) tablet Take 1 tablet by mouth daily.   Yes [provider]  ?nystatin (MYCOSTATIN) 100000 UNIT/ML suspension Swish and swallow 5 mLs 4 (four) times daily for 10 days 01/21/22 02/04/22 Yes [provider]  ?omeprazole (PRILOSEC) 20 MG capsule Take 20 mg by mouth daily after breakfast.    Yes [provider]  ?potassium chloride (K-DUR,KLOR-CON) 10 MEQ tablet Take 10 mEq by mouth daily after breakfast.    Yes [provider]  ?Probiotic Product (PROBIOTIC PO) Take 1 tablet by mouth daily.   Yes [provider]  ?PROLIA 60 MG/ML SOSY injection Inject into the skin. 12/13/21  Yes [provider]  ?traMADol (ULTRAM) 50 MG tablet Take 50 mg by mouth 3 (three) times daily as needed. 01/29/22  Yes [provider]  ?triamterene-hydrochlorothiazide (DYAZIDE) 37.5-25 MG capsule Take 1 capsule by mouth  daily after breakfast.    Yes [provider]  ?verapamil (CALAN) 120 MG tablet Take 60 mg by mouth 2 (two) times daily. Breakfast & supper   Yes [provider]  ?alendronate (FOSAMAX) 70 MG table

## 2022-02-01 NOTE — ED Provider Notes (Signed)
? ?[facility] ?Provider Note ? ? ? Event Date/Time  ? First MD Initiated Contact with Patient 02/01/22 1403   ?  (approximate) ? ? ?History  ? ?Hip Injury (+ right hip fracture ) ? ? ?HPI ? ?Tracy Harvey is a 76 y.o. female presents to the ER from outpatient clinic for right hip and leg pain after mechanical fall where she got tripped up with her walker.  Did not hit her head she is not on antiblood bodies no blood thinners.  Denies any dysuria no chest pain or shortness of breath.  No numbness or tingling ?  ? ? ?Physical Exam  ? ?Triage Vital Signs: ?ED Triage Vitals  ?Enc Vitals Group  ?   BP 02/01/22 1406 (!) 170/89  ?   Pulse Rate 02/01/22 1406 (!) 104  ?   Resp 02/01/22 1411 18  ?   Temp 02/01/22 1410 98.1 ?F (36.7 ?C)  ?   Temp Source 02/01/22 1410 Oral  ?   SpO2 02/01/22 1406 92 %  ?   Weight 02/01/22 1407 143 lb (64.9 kg)  ?   Height 02/01/22 1407 5' (1.524 m)  ?   Head Circumference --   ?   Peak Flow --   ?   Pain Score 02/01/22 1407 8  ?   Pain Loc --   ?   Pain Edu? --   ?   Excl. in [place]? --   ? ? ?Most recent vital signs: ?Vitals:  ? 02/01/22 1410 02/01/22 1411  ?BP:    ?Pulse:    ?Resp:  18  ?Temp: 98.1 ?F (36.7 ?C)   ?SpO2:    ? ? ? ?Constitutional: Alert  ?Eyes: Conjunctivae are normal.  ?Head: Atraumatic. ?Nose: No congestion/rhinnorhea. ?Mouth/Throat: Mucous membranes are moist.   ?Neck: Painless ROM.  ?Cardiovascular:   Good peripheral circulation. ?Respiratory: Normal respiratory effort.  No retractions.  ?Gastrointestinal: Soft and nontender.  ?Musculoskeletal:  no deformity  pain with movement of right leg. ?Neurologic:  MAE spontaneously. No gross focal neurologic deficits are appreciated.  ?Skin:  Skin is warm, dry and intact. No rash noted. ?Psychiatric: Mood and affect are normal. Speech and behavior are normal. ? ? ? ?ED Results / Procedures / Treatments  ? ?Labs ?(all labs ordered are listed, but only abnormal results are displayed) ?Labs Reviewed  ?CBC -  Abnormal; Notable for the following components:  ?    Result Value  ? WBC 16.4 (*)   ? All other components within normal limits  ?COMPREHENSIVE METABOLIC PANEL - Abnormal; Notable for the following components:  ? Sodium 128 (*)   ? Chloride 97 (*)   ? Glucose, Bld 107 (*)   ? BUN 24 (*)   ? Creatinine, Ser 1.04 (*)   ? GFR, Estimated 55 (*)   ? All other components within normal limits  ?TYPE AND SCREEN  ?ABO/RH  ? ? ? ?EKG ? ?ED ECG REPORT ?I, Merlyn Lot, the attending physician, personally viewed and interpreted this ECG. ? ? Date: 02/01/2022 ? EKG Time: 14:47 ? Rate: 80 ? Rhythm: sinus ? Axis: normal ? Intervals:  normal ? ST&T Change: no stemi, no depression ? ? ? ?RADIOLOGY ?Please see ED Course for my review and interpretation. ? ?I personally reviewed all radiographic images ordered to evaluate for the above acute complaints and reviewed radiology reports and findings.  These findings were personally discussed with the patient.  Please see medical record for radiology report. ? ? ? ?  PROCEDURES: ? ?Critical Care performed: No ? ?Procedures ? ? ?MEDICATIONS ORDERED IN ED: ?Medications  ?sodium chloride 0.9 % bolus 500 mL (has no administration in time range)  ? ? ? ?IMPRESSION / MDM / ASSESSMENT AND PLAN / ED COURSE  ?I reviewed the triage vital signs and the nursing notes. ?             ?               ? ?Differential diagnosis includes, but is not limited to, fracture, contusion, dislocation, electrolyte abnormality  ? ? ?Patient presenting to the ER with right hip pain left knee pain left wrist pain with evidence of right nondisplaced subtrochanteric femur fracture.  Not on anticoagulation.  Will consult orthopedics. ? ? ?Clinical Course as of 02/01/22 1520  ?Fri Feb 01, 2022  ?1418 Discussed case with Dr. Roland Rack of orthopedics he was reviewed imaging and plans operation today. [PR]  ?70 My review and interpretation of knee film shows no evidence of dislocation will await formal radiology report [PR]   ?1517 Patient does have mild hyponatremia we will give a small bolus of IV fluid also noted leukocytosis no other signs or symptoms suggest infection.  Have consulted with hospitalist for admission. [PR]  ?  ?Clinical Course User Index ?[PR] Merlyn Lot, MD  ? ? ? ?FINAL CLINICAL IMPRESSION(S) / ED DIAGNOSES  ? ?Final diagnoses:  ?Closed nondisplaced subtrochanteric fracture of right femur, initial encounter ([place])  ? ? ? ?Rx / DC Orders  ? ?ED Discharge Orders   ? ? None  ? ?  ? ? ? ?Note:  This document was prepared using Dragon voice recognition software and may include unintentional dictation errors. ? ?  ?Merlyn Lot, MD ?02/01/22 1521 ? ?

## 2022-02-01 NOTE — ED Notes (Signed)
Informed RN bed assigned 

## 2022-02-01 NOTE — H&P (Addendum)
?History and Physical  ? ? ?Patient: Tracy Harvey [id number]:[id number] DOB: 1946/03/27 ?DOA: 02/01/2022 ?DOS: the patient was seen and examined on 02/01/2022 ?PCP: Rusty Aus, MD  ?Patient coming from: Home ? ?Chief Complaint:  ?Chief Complaint  ?Patient presents with  ? Hip Injury  ?  + right hip fracture   ? ?HPI: Tracy Harvey is a 76 y.o. female with medical history significant for osteoarthritis, hypertension, GERD, asthma who presents to the ER from the outpatient orthopedic clinic for evaluation of right hip pain following a mechanical fall after she tripped on her rolling walker. ?Patient states that she fell about 6 weeks ago on 01/20/22 and has had persistent right hip pain which she initially attributed to bursitis.  She was seen in the outpatient setting and had serial x-rays which did not show any fracture.  She has been getting around the house using a rolling walker and fell again on the morning of her admission when she tripped on the walker.  She denies hitting her head or any loss of consciousness.  She has been unable to bear weight on her right lower extremity. ?She has redness in her left eye but denies having any pain in that eye or loss of vision.  She also denies having any discharge from the left eye. ?She denies having any fever, no headache, no chills, no shortness of breath, no nausea, no vomiting, no changes in her bowel habits, no urinary symptoms, no dizziness or lightheadedness. ?X-ray of the right hip shows subtle subacute nondisplaced fracture of the right ?subtrochanteric femur. Mild right hip osteoarthritis. ?She will be admitted to the hospital for further evaluation. ?Review of Systems: As mentioned in the history of present illness. All other systems reviewed and are negative. ?Past Medical History:  ?Diagnosis Date  ? Arthritis   ? Asthma   ? Basal cell carcinoma   ? face  ? Cancer [facility])   ? SKIN CANCER-BASAL CELL  ? Complication of anesthesia   ? Dysrhythmia   ? PACs, SVT  ?  Foot fracture, left   ? WEARING BRACE  ? GERD (gastroesophageal reflux disease)   ? Headache   ? H/O MIGRAINES  ? History of hiatal hernia   ? Hypertension   ? IBS (irritable bowel syndrome)   ? PONV (postoperative nausea and vomiting)   ? Shortness of breath dyspnea   ? Wheezing   ? OCC  ? ?Past Surgical History:  ?Procedure Laterality Date  ? ABDOMINAL HYSTERECTOMY    ? BONE EXCISION Left 04/27/2020  ? Procedure: PARTIAL EXCISION BONE-PHALANX;  Surgeon: Albertine Patricia, DPM;  Location: [place];  Service: Podiatry;  Laterality: Left;  IVA LOCAL  ? CATARACT EXTRACTION W/PHACO Right 02/05/2016  ? Procedure: CATARACT EXTRACTION PHACO AND INTRAOCULAR LENS PLACEMENT (IOC);  Surgeon: Estill Cotta, MD;  Location: [facility];  Service: Ophthalmology;  Laterality: Right;  [place] 01:02 ?AP% 23.9 ?CDE 25.0 ?fluid pack lot # [id number]  ? CATARACT EXTRACTION W/PHACO Left 10/26/2020  ? Procedure: CATARACT EXTRACTION PHACO AND INTRAOCULAR LENS PLACEMENT (IOC) LEFT 6.73 00:37.8;  Surgeon: Birder Robson, MD;  Location: [place];  Service: Ophthalmology;  Laterality: Left;  ? colonoscopy with polypectomy    ? COLONOSCOPY WITH PROPOFOL N/A 02/03/2019  ? Procedure: COLONOSCOPY WITH PROPOFOL;  Surgeon: Manya Silvas, MD;  Location: [facility];  Service: Endoscopy;  Laterality: N/A;  ? ESOPHAGOGASTRODUODENOSCOPY    ? FOOT SURGERY    ? HAND SURGERY    ? MOUTH  SURGERY    ? NASAL SINUS SURGERY    ? ORIF ELBOW FRACTURE Left 11/26/2019  ? Procedure: OPEN REDUCTION INTERNAL FIXATION (ORIF) ELBOW/OLECRANON FRACTURE;  Surgeon: Dereck Leep, MD;  Location: [facility];  Service: Orthopedics;  Laterality: Left;  ? TONSILLECTOMY    ? ?Social History:  reports that she has never smoked. She has never used smokeless tobacco. She reports that she does not drink alcohol and does not use drugs. ? ?Allergies  ?Allergen Reactions  ? Augmentin [Amoxicillin-Pot Clavulanate] Nausea And Vomiting  ?  Did it involve swelling of the  face/tongue/throat, SOB, or low BP? No ?Did it involve sudden or severe rash/hives, skin peeling, or any reaction on the inside of your mouth or nose? No ?Did you need to seek medical attention at a hospital or doctor's office? No ?When did it last happen? More than 20 years ago     ?If all above answers are ?NO?, may proceed with cephalosporin use. ?  ? Chocolate Flavor Other (See Comments)  ?  Headaches.  ? Egg [Eggs Or Egg-Derived Products] Nausea And Vomiting and Other (See Comments)  ?  Severe migraines.  ? Eryc [Erythromycin] Other (See Comments)  ?  Unknown reaction  ? Morphine And Related Nausea And Vomiting  ?  Vomiting for days.  ? Tilactase Other (See Comments)  ?  Unknown reaction.  ? ? ?Family History  ?Problem Relation Age of Onset  ? Breast cancer Other   ? ? ?Prior to Admission medications   ?Medication Sig Start Date End Date Taking? Authorizing Provider  ?albuterol (PROVENTIL HFA;VENTOLIN HFA) 108 (90 Base) MCG/ACT inhaler Inhale 2 puffs into the lungs every 6 (six) hours as needed for wheezing or shortness of breath.   Yes [provider]  ?amLODipine (NORVASC) 5 MG tablet Take 2.5 mg by mouth every evening.    Yes [provider]  ?Ascorbic Acid (VITAMIN C) 100 MG tablet Take 500 mg by mouth daily.   Yes [provider]  ?aspirin 81 MG chewable tablet Chew 81 mg by mouth daily.   Yes [provider]  ?BIOTIN PO Take by mouth.   Yes [provider]  ?budesonide-formoterol (SYMBICORT) 160-4.5 MCG/ACT inhaler Inhale 2 puffs into the lungs 2 (two) times daily.   Yes [provider]  ?Calcium Carb-Cholecalciferol (CALCIUM 600-D PO) Take 1 tablet by mouth 3 (three) times daily.   Yes [provider]  ?Cholecalciferol (VITAMIN D3) 50 MCG (2000 UT) TABS Take 2,000 Units by mouth daily after breakfast.   Yes [provider]  ?diltiazem (TIAZAC) 180 MG 24 hr capsule Take 180 mg by mouth at bedtime.   Yes [provider]   ?estradiol (ESTRACE) 1 MG tablet Take 1 mg by mouth daily after breakfast.    Yes [provider]  ?fexofenadine (ALLEGRA) 180 MG tablet Take 90 mg by mouth daily.   Yes [provider]  ?fluticasone (FLONASE) 50 MCG/ACT nasal spray Place 2 sprays into both nostrils daily after breakfast.   Yes [provider]  ?losartan-hydrochlorothiazide (HYZAAR) 100-12.5 MG tablet Take 1 tablet by mouth daily after breakfast.    Yes [provider]  ?Magnesium 500 MG TABS Take 250 mg by mouth daily after breakfast. Hold for diarrhea   Yes [provider]  ?montelukast (SINGULAIR) 10 MG tablet Take 10 mg by mouth at bedtime.   Yes [provider]  ?Multiple Vitamin (MULTIVITAMIN) tablet Take 1 tablet by mouth daily.   Yes [provider]  ?omeprazole (PRILOSEC) 20 MG capsule Take 20 mg by mouth daily after breakfast.    Yes [provider]  ?potassium chloride (K-DUR,KLOR-CON) 10 MEQ tablet Take 10 mEq by mouth daily after breakfast.    Yes [provider]  ?Probiotic Product (PROBIOTIC PO) Take 1 tablet by mouth daily.   Yes [provider]  ?triamterene-hydrochlorothiazide (DYAZIDE) 37.5-25 MG capsule Take 1 capsule by mouth daily after breakfast.    Yes [provider]  ?verapamil (CALAN) 120 MG tablet Take 60 mg by mouth 2 (two) times daily. Breakfast & supper   Yes [provider]  ?acetaminophen (TYLENOL) 500 MG tablet Take 1,000 mg by mouth every 6 (six) hours as needed (for pain.).     [provider]  ?alendronate (FOSAMAX) 70 MG tablet Take 70 mg by mouth every Wednesday. Take with a full glass of water on an empty stomach. ?Patient not taking: Reported on 02/01/2022    [provider]  ?azelastine (ASTELIN) 0.1 % nasal spray Place into both nostrils daily as needed for rhinitis. Use in each nostril as directed    [provider]  ?loperamide (IMODIUM) 2 MG capsule Take 2 mg by mouth 4  (four) times daily as needed for diarrhea or loose stools.  ?Patient not taking: Reported on 02/01/2022    [provider]  ?ondansetron (ZOFRAN) 4 MG tablet Take 0.5 tablets (2 mg total) by mouth eve

## 2022-02-01 NOTE — ED Triage Notes (Signed)
Pt to ED from [facility] by Dr. Sabra Heck. Pt seen due to recent fall and scans showing + right hip fracture from CT. Pt states initial fall Feb 5th and fell again this morning. Pt denies LOC or head trauma.  ?

## 2022-02-01 NOTE — Assessment & Plan Note (Addendum)
-   s/p mechanical fall with a nondisplaced subtrochanteric fracture of right femur ?- s/p reduction and internal fixation with orthopedic surgery on 02/01/2022 ?- Continue working with PT ?- Continue pain control ?- Weightbearing as tolerated to right lower extremity ?-Been accepted at [facility] ?

## 2022-02-01 NOTE — Assessment & Plan Note (Addendum)
-   suspected due to HCTZ and possibly some volume depletion  ?- Hold hydrochlorothiazide; will not resume at discharge ?- Continue encouraging good oral intake ?

## 2022-02-01 NOTE — Transfer of Care (Signed)
Immediate Anesthesia Transfer of Care Note ? ?Patient: Tracy Harvey ? ?Procedure(s) Performed: INTRAMEDULLARY (IM) NAIL INTERTROCHANTRIC (Right: Hip) ? ?Patient Location: PACU ? ?Anesthesia Type:Spinal ? ?Level of Consciousness: awake, alert  and oriented ? ?Airway & Oxygen Therapy: Patient Spontanous Breathing ? ?Post-op Assessment: Report given to RN and Post -op Vital signs reviewed and stable ? ?Post vital signs: Reviewed and stable ? ?Last Vitals:  ?Vitals Value Taken Time  ?BP 141/65 02/01/22 1900  ?Temp 36.1 ?C 02/01/22 1854  ?Pulse 86 02/01/22 1903  ?Resp 14 02/01/22 1903  ?SpO2 97 % 02/01/22 1903  ?Vitals shown include unvalidated device data. ? ?Last Pain:  ?Vitals:  ? 02/01/22 1854  ?TempSrc:   ?PainSc: 0-No pain  ?   ? ?Patients Stated Pain Goal: 0 (02/01/22 1651) ? ?Complications: No notable events documented. ?

## 2022-02-01 NOTE — Anesthesia Procedure Notes (Signed)
Spinal ? ?Patient location during procedure: OR ?Start time: 02/01/2022 5:25 PM ?End time: 02/01/2022 5:30 PM ?Reason for block: surgical anesthesia ?Staffing ?Performed: resident/CRNA  ?Anesthesiologist: Arita Miss, MD ?Resident/CRNA: Norm Salt, CRNA ?Preanesthetic Checklist ?Completed: patient identified, IV checked, site marked, risks and benefits discussed, surgical consent, monitors and equipment checked, pre-op evaluation and timeout performed ?Spinal Block ?Patient position: sitting ?Prep: ChloraPrep ?Patient monitoring: heart rate, continuous pulse ox and blood pressure ?Approach: midline ?Location: L3-4 ?Injection technique: single-shot ?Needle ?Needle type: Pencan  ?Needle gauge: 24 G ?Needle length: 10 cm ?Assessment ?Events: CSF return ?Additional Notes ?IV functioning, monitors applied to pt. Expiration date of kit checked and confirmed to be in date. Sterile prep and drape, hand hygiene and sterile gloved used. Pt was positioned and spine was prepped in sterile fashion. Skin was anesthetized with lidocaine. Free flow of clear CSF obtained prior to injecting local anesthetic into CSF x 1 attempt. Spinal needle aspirated freely following injection. Needle was carefully withdrawn, and pt tolerated procedure well. Loss of motor and sensory on exam post injection.  ? ? ? ?

## 2022-02-01 NOTE — Anesthesia Preprocedure Evaluation (Signed)
Anesthesia Evaluation  ?Patient identified by MRN, date of birth, ID band ?Patient awake ? ? ? ?Reviewed: ?Allergy & Precautions, H&P , NPO status , Patient's Chart, lab work & pertinent test results ? ?History of Anesthesia Complications ?(+) PONV and history of anesthetic complications ? ?Airway ?Mallampati: II ? ?TM Distance: <3 FB ?Neck ROM: full ? ? ? Dental ?no notable dental hx. ?(+) Teeth Intact ?  ?Pulmonary ?asthma , neg sleep apnea, neg COPD, Patient abstained from smoking.Not current smoker,  ?  ?breath sounds clear to auscultation ? ? ? ? ? ? Cardiovascular ?Exercise Tolerance: Good ?METShypertension, Pt. on medications ?(-) CAD and (-) Past MI + dysrhythmias  ?Rhythm:Irregular Rate:Normal ? ?Hx PVCs ?  ?Neuro/Psych ? Headaches, negative psych ROS  ? GI/Hepatic ?GERD  Medicated and Controlled,(+)  ?  ? (-) substance abuse ? ,   ?Endo/Other  ?neg diabetes ? Renal/GU ?negative Renal ROS  ? ?  ?Musculoskeletal ? ?(+) Arthritis ,  ? Abdominal ?  ?Peds ? Hematology ?  ?Anesthesia Other Findings ?Past Medical History: ?No date: Arthritis ?No date: Asthma ?No date: Basal cell carcinoma ?    Comment:  face ?No date: Cancer [facility]) ?    Comment:  SKIN CANCER-BASAL CELL ?No date: Complication of anesthesia ?No date: Dysrhythmia ?    Comment:  PACs, SVT ?No date: Foot fracture, left ?    Comment:  WEARING BRACE ?No date: GERD (gastroesophageal reflux disease) ?No date: Headache ?    Comment:  H/O MIGRAINES ?No date: History of hiatal hernia ?No date: Hypertension ?No date: IBS (irritable bowel syndrome) ?No date: PONV (postoperative nausea and vomiting) ?No date: Shortness of breath dyspnea ?No date: Wheezing ?    Comment:  OCC ? Reproductive/Obstetrics ? ?  ? ? ? ? ? ? ? ? ? ? ? ? ? ?  ?  ? ? ? ? ? ? ? ? ?Anesthesia Physical ? ?Anesthesia Plan ? ?ASA: 3 ? ?Anesthesia Plan: Spinal  ? ?Post-op Pain Management: Ofirmev IV (intra-op)*  ? ?Induction: Intravenous ? ?PONV Risk Score and  Plan: 3 and Treatment may vary due to age or medical condition, TIVA, Midazolam, Propofol infusion, Ondansetron and Dexamethasone ? ?Airway Management Planned: Natural Airway ? ?Additional Equipment: None ? ?Intra-op Plan:  ? ?Post-operative Plan:  ? ?Informed Consent: I have reviewed the patients History and Physical, chart, labs and discussed the procedure including the risks, benefits and alternatives for the proposed anesthesia with the patient or authorized representative who has indicated his/her understanding and acceptance.  ? ? ? ?Dental Advisory Given ? ?Plan Discussed with: CRNA ? ?Anesthesia Plan Comments: (Discussed R/B/A of neuraxial anesthesia technique with patient: ?- rare risks of spinal/epidural hematoma, nerve damage, infection ?- Risk of PDPH ?- Risk of nausea and vomiting ?- Risk of conversion to general anesthesia and its associated risks, including sore throat, damage to lips/eyes/teeth/oropharynx, and rare risks such as cardiac and respiratory events. ?- Risk of allergic reactions ?- post operative cognitive dysfunction ? ?Discussed the role of CRNA in patient's perioperative care. ? ?Patient voiced understanding.)  ? ? ? ? ? ? ?Anesthesia Quick Evaluation ? ?

## 2022-02-01 NOTE — Assessment & Plan Note (Addendum)
-   Low suspicion for underlying infection ?- Likely due to reactive process ?

## 2022-02-01 NOTE — Assessment & Plan Note (Addendum)
-   Continue amlodipine and verapamil ?-If blood pressure becomes elevated, resume losartan ?

## 2022-02-02 DIAGNOSIS — I10 Essential (primary) hypertension: Secondary | ICD-10-CM | POA: Diagnosis not present

## 2022-02-02 DIAGNOSIS — E876 Hypokalemia: Secondary | ICD-10-CM

## 2022-02-02 DIAGNOSIS — S7224XA Nondisplaced subtrochanteric fracture of right femur, initial encounter for closed fracture: Secondary | ICD-10-CM | POA: Diagnosis not present

## 2022-02-02 LAB — CBC
HCT: 31.8 % — ABNORMAL LOW (ref 36.0–46.0)
Hemoglobin: 10.6 g/dL — ABNORMAL LOW (ref 12.0–15.0)
MCH: 30.8 pg (ref 26.0–34.0)
MCHC: 33.3 g/dL (ref 30.0–36.0)
MCV: 92.4 fL (ref 80.0–100.0)
Platelets: 282 10*3/uL (ref 150–400)
RBC: 3.44 MIL/uL — ABNORMAL LOW (ref 3.87–5.11)
RDW: 13.6 % (ref 11.5–15.5)
WBC: 11.8 10*3/uL — ABNORMAL HIGH (ref 4.0–10.5)
nRBC: 0 % (ref 0.0–0.2)

## 2022-02-02 LAB — BASIC METABOLIC PANEL
Anion gap: 8 (ref 5–15)
BUN: 18 mg/dL (ref 8–23)
CO2: 23 mmol/L (ref 22–32)
Calcium: 8.1 mg/dL — ABNORMAL LOW (ref 8.9–10.3)
Chloride: 96 mmol/L — ABNORMAL LOW (ref 98–111)
Creatinine, Ser: 0.72 mg/dL (ref 0.44–1.00)
GFR, Estimated: >60 mL/min (ref >60)
Glucose, Bld: 105 mg/dL — ABNORMAL HIGH (ref 70–99)
Potassium: 3.4 mmol/L — ABNORMAL LOW (ref 3.5–5.1)
Sodium: 127 mmol/L — ABNORMAL LOW (ref 135–145)

## 2022-02-02 MED ORDER — ENOXAPARIN SODIUM 40 MG/0.4ML IJ SOSY: 40.0000 mg | PREFILLED_SYRINGE | INTRAMUSCULAR | 0 refills | Status: DC

## 2022-02-02 MED ORDER — HYDROCODONE-ACETAMINOPHEN 5-325 MG PO TABS: 1.0000 | ORAL_TABLET | Freq: Four times a day (QID) | ORAL | 0 refills | Status: DC | PRN

## 2022-02-02 MED ORDER — SODIUM CHLORIDE 0.9 % IV SOLN: INTRAVENOUS | Status: DC

## 2022-02-02 NOTE — Assessment & Plan Note (Signed)
-   Replete as needed 

## 2022-02-02 NOTE — Progress Notes (Signed)
?Progress Note ? ? ? ?Tracy Harvey   ?VQM:[id number]  ?DOB: [birth year]  ?DOA: 02/01/2022     1 ?PCP: Tracy Aus, MD ? ?Initial CC: fall at home ? ?Hospital Course: ?Tracy Harvey is a 76 year old female with PMH asthma, GERD, HTN, IBS, osteoarthritis who presented to the hospital from outpatient orthopedic clinic after a mechanical fall from tripping on her rolling walker.  She had fallen approximately 6 weeks ago on 01/20/2022 and had persistent right-sided hip pain. ?Imaging on admission showed subtle subacute nondisplaced fracture of the right subtrochanteric femur.  Orthopedic surgery was consulted and she was taken to the OR for surgical repair on 02/01/2022. ? ?Interval History:  ?Seen in her room this morning with grandson at bedside.  Feeling relatively well and working with physical therapy today. ? ?Assessment and Plan: ?* Nondisplaced subtrochanteric fracture of right femur, initial encounter for closed fracture ([place]) ?- s/p mechanical fall with a nondisplaced subtrochanteric fracture of right femur ?- s/p reduction and internal fixation with orthopedic surgery on 02/01/2022 ?- Continue working with PT ?- Continue pain control ?- Weightbearing as tolerated to right lower extremity ?-Tentative plan for discharging to SNF probably  ? ?Hypokalemia ?- Replete as needed ? ?Leukocytosis ?- Low suspicion for underlying infection ?- Likely due to reactive process ? ?Essential hypertension ?- Continue amlodipine and verapamil ? ?GERD (gastroesophageal reflux disease) ?- Continue pantoprazole ? ?Hyponatremia ?Secondary to HCTZ use and possibly some volume depletion  ?Hold hydrochlorothiazide ?Gentle IV fluid hydration ?- trend BMP ? ? ?Old records reviewed in assessment of this patient ? ?Antimicrobials: ? ? ?DVT prophylaxis:  ?enoxaparin (LOVENOX) injection 40 mg Start: 02/02/22 0800 ?SCDs Start: 02/01/22 2005 ? ? ?Code Status:   Code Status: Full Code ? ?Disposition Plan:  SNF ?Status is: Inpt ? ?Objective: ?Blood  pressure (!) 153/66, pulse 83, temperature 98.3 ?F (36.8 ?C), resp. rate 17, height 5' (1.524 m), weight 64.9 kg, SpO2 95 %.  ?Examination:  ?Physical Exam ?Constitutional:   ?   General: She is not in acute distress. ?   Appearance: Normal appearance.  ?HENT:  ?   Head: Normocephalic and atraumatic.  ?   Mouth/Throat:  ?   Mouth: Mucous membranes are moist.  ?Eyes:  ?   Extraocular Movements: Extraocular movements intact.  ?Cardiovascular:  ?   Rate and Rhythm: Normal rate and regular rhythm.  ?Pulmonary:  ?   Effort: Pulmonary effort is normal.  ?   Breath sounds: Normal breath sounds.  ?Abdominal:  ?   General: Bowel sounds are normal. There is no distension.  ?   Palpations: Abdomen is soft.  ?   Tenderness: There is no abdominal tenderness.  ?Musculoskeletal:  ?   Cervical back: Normal range of motion and neck supple.  ?   Comments: Right surgical dressings in place  ?Skin: ?   General: Skin is warm and dry.  ?Neurological:  ?   General: No focal deficit present.  ?   Mental Status: She is alert.  ?Psychiatric:     ?   Mood and Affect: Mood normal.     ?   Behavior: Behavior normal.  ?  ? ?Consultants:  ?Orthopedic surgery ? ?Procedures:  ?Reduction and internal fixation of nondisplaced subtrochanteric right hip fracture, 02/01/2022 ? ?Data Reviewed: ?Results for orders placed or performed during the hospital encounter of 02/01/22 (from the past 24 hour(s))  ?CBC     Status: Abnormal  ? Collection Time: 02/01/22  2:40 PM  ?Result  Value Ref Range  ? WBC 16.4 (H) 4.0 - 10.5 K/uL  ? RBC 4.21 3.87 - 5.11 MIL/uL  ? Hemoglobin 13.0 12.0 - 15.0 g/dL  ? HCT 39.1 36.0 - 46.0 %  ? MCV 92.9 80.0 - 100.0 fL  ? MCH 30.9 26.0 - 34.0 pg  ? MCHC 33.2 30.0 - 36.0 g/dL  ? RDW 13.9 11.5 - 15.5 %  ? Platelets 359 150 - 400 K/uL  ? nRBC 0.0 0.0 - 0.2 %  ?Comprehensive metabolic panel     Status: Abnormal  ? Collection Time: 02/01/22  2:40 PM  ?Result Value Ref Range  ? Sodium 128 (L) 135 - 145 mmol/L  ? Potassium 3.7 3.5 - 5.1  mmol/L  ? Chloride 97 (L) 98 - 111 mmol/L  ? CO2 22 22 - 32 mmol/L  ? Glucose, Bld 107 (H) 70 - 99 mg/dL  ? BUN 24 (H) 8 - 23 mg/dL  ? Creatinine, Ser 1.04 (H) 0.44 - 1.00 mg/dL  ? Calcium 9.1 8.9 - 10.3 mg/dL  ? Total Protein 7.3 6.5 - 8.1 g/dL  ? Albumin 3.8 3.5 - 5.0 g/dL  ? AST 40 15 - 41 U/L  ? ALT 34 0 - 44 U/L  ? Alkaline Phosphatase 57 38 - 126 U/L  ? Total Bilirubin 0.8 0.3 - 1.2 mg/dL  ? GFR, Estimated 55 (L) >60 mL/min  ? Anion gap 9 5 - 15  ?ABO/Rh     Status: None  ? Collection Time: 02/01/22  2:40 PM  ?Result Value Ref Range  ? ABO/RH(D)    ?  A POS ?Performed at [facility], [address] [place], [place] [address] ?  ?Type and screen [place]     Status: None  ? Collection Time: 02/01/22  4:26 PM  ?Result Value Ref Range  ? ABO/RH(D) A POS   ? Antibody Screen NEG   ? Sample Expiration    ?  02/04/2022,2359 ?Performed at [facility], [address], [place], [place] [address] ?  ?CBC     Status: Abnormal  ? Collection Time: 02/02/22  4:04 AM  ?Result Value Ref Range  ? WBC 11.8 (H) 4.0 - 10.5 K/uL  ? RBC 3.44 (L) 3.87 - 5.11 MIL/uL  ? Hemoglobin 10.6 (L) 12.0 - 15.0 g/dL  ? HCT 31.8 (L) 36.0 - 46.0 %  ? MCV 92.4 80.0 - 100.0 fL  ? MCH 30.8 26.0 - 34.0 pg  ? MCHC 33.3 30.0 - 36.0 g/dL  ? RDW 13.6 11.5 - 15.5 %  ? Platelets 282 150 - 400 K/uL  ? nRBC 0.0 0.0 - 0.2 %  ?Basic metabolic panel     Status: Abnormal  ? Collection Time: 02/02/22  4:04 AM  ?Result Value Ref Range  ? Sodium 127 (L) 135 - 145 mmol/L  ? Potassium 3.4 (L) 3.5 - 5.1 mmol/L  ? Chloride 96 (L) 98 - 111 mmol/L  ? CO2 23 22 - 32 mmol/L  ? Glucose, Bld 105 (H) 70 - 99 mg/dL  ? BUN 18 8 - 23 mg/dL  ? Creatinine, Ser 0.72 0.44 - 1.00 mg/dL  ? Calcium 8.1 (L) 8.9 - 10.3 mg/dL  ? GFR, Estimated >60 >60 mL/min  ? Anion gap 8 5 - 15  ?  ?I have Reviewed nursing notes, Vitals, and Lab results since pt's last encounter. Pertinent lab results : see above ?I have ordered test including BMP, CBC,  Mg ?I have reviewed  the last note from staff over past 24 hours ?I have discussed pt's care plan and test results with nursing staff, case manager ? ? LOS: 1 day  ? ?Dwyane Dee, MD ?[facility] ?02/02/2022, 12:31 PM ? ?

## 2022-02-02 NOTE — Progress Notes (Signed)
?  Subjective: ?1 Day Post-Op Procedure(s) (LRB): ?INTRAMEDULLARY (IM) NAIL INTERTROCHANTRIC (Right) ?Patient reports pain as mild.   ?Patient is well, and has had no acute complaints or problems.  The patient is complaining of some nausea this morning. ?Plan is to go Rehab versus home after hospital stay. ?Negative for chest pain and shortness of breath ?Fever: no ?Gastrointestinal: Complaining of mild nausea but no vomiting ? ?Objective: ?Vital signs in last 24 hours: ?Temp:  [96.9 ?F (36.1 ?C)-98.4 ?F (36.9 ?C)] 98.4 ?F (36.9 ?C) (03/18 0630) ?Pulse Rate:  [60-104] 60 (03/18 0313) ?Resp:  [15-21] 16 (03/18 0313) ?BP: (111-179)/(64-89) 156/70 (03/18 0313) ?SpO2:  [92 %-100 %] 93 % (03/18 0313) ?Weight:  [64.9 kg] 64.9 kg (03/17 1651) ? ?Intake/Output from previous day: ? ?Intake/Output Summary (Last 24 hours) at 02/02/2022 0654 ?Last data filed at 02/02/2022 0530 ?Gross per 24 hour  ?Intake 1693.28 ml  ?Output 1825 ml  ?Net -131.72 ml  ?  ?Intake/Output this shift: ?Total I/O ?In: 793.3 [I.V.:693.3; IV Piggyback:100] ?Out: [place] [[id number]] ? ?Labs: ?Recent Labs  ?  02/01/22 ?1440 02/02/22 ?0404  ?HGB 13.0 10.6*  ? ?Recent Labs  ?  02/01/22 ?1440 02/02/22 ?0404  ?WBC 16.4* 11.8*  ?RBC 4.21 3.44*  ?HCT 39.1 31.8*  ?PLT 359 282  ? ?Recent Labs  ?  02/01/22 ?1440 02/02/22 ?0404  ?NA 128* 127*  ?K 3.7 3.4*  ?CL 97* 96*  ?CO2 22 23  ?BUN 24* 18  ?CREATININE 1.04* 0.72  ?GLUCOSE 107* 105*  ?CALCIUM 9.1 8.1*  ? ?No results for input(s): LABPT, INR in the last 72 hours. ? ? ?EXAM ?General - Patient is Alert and Oriented ?Extremity - Neurovascular intact ?Sensation intact distally ?Dorsiflexion/Plantar flexion intact ?Compartment soft ?Dressing/Incision - clean, dry, scant drainage ?Motor Function - intact, moving foot and toes well on exam.  ? ?Past Medical History:  ?Diagnosis Date  ? Arthritis   ? Asthma   ? Basal cell carcinoma   ? face  ? Cancer [facility])   ? SKIN CANCER-BASAL CELL  ? Complication of anesthesia   ?  Dysrhythmia   ? PACs, SVT  ? Foot fracture, left   ? WEARING BRACE  ? GERD (gastroesophageal reflux disease)   ? Headache   ? H/O MIGRAINES  ? History of hiatal hernia   ? Hypertension   ? IBS (irritable bowel syndrome)   ? PONV (postoperative nausea and vomiting)   ? Shortness of breath dyspnea   ? Wheezing   ? OCC  ? ? ?Assessment/Plan: ?1 Day Post-Op Procedure(s) (LRB): ?INTRAMEDULLARY (IM) NAIL INTERTROCHANTRIC (Right) ?Principal Problem: ?  Nondisplaced subtrochanteric fracture of right femur, initial encounter for closed fracture ([place]) ?Active Problems: ?  Hyponatremia ?  GERD (gastroesophageal reflux disease) ?  Essential hypertension ?  Leukocytosis ? ?Estimated body mass index is 27.94 kg/m? as calculated from the following: ?  Height as of this encounter: 5' (1.524 m). ?  Weight as of this encounter: 64.9 kg. ?Advance diet ?Up with therapy ? ?Care management for discharge planning after physical therapy ? ?DVT Prophylaxis - Lovenox and compression devices ?Weight-Bearing as tolerated to right leg ? ?Reche Dixon, PA-C ?Orthopaedic Surgery ?02/02/2022, 6:54 AM ? ?

## 2022-02-02 NOTE — Evaluation (Signed)
Physical Therapy Evaluation ?Patient Details ?Name: Tracy Harvey ?MRN: [id number] ?DOB: 01-Nov-1946 ?Today's Date: 02/02/2022 ? ?History of Present Illness ? Pt is 76 y/o F admitted on 02/01/22 after presenting to the ER from the outpatient ortho clinic for evaluation of R hip pain following a fall. Pt fell about 6 weeks ago & again the morning of admission. X-ray of the right hip shows subtle subacute nondisplaced fracture of the R subtrochanteric femur. Pt underwent surgery for R IM nail placement on 02/01/22. PMH: OA, HTN, GERD, asthma, hiatal hernia, IBS  ?Clinical Impression ? Pt seen for PT evaluation with pt's grandson present. Pt reports prior to fall in Feb. 2023 she was independent without AD, only recently using RW for pain management. Pt lives alone in a 1 level home with stairs to enter & was independent with driving, cooking & cleaning. PT provides pt with HEP handout & assists pt with RLE strengthening exercises. Pt requires min assist for bed mobility with reliance on hospital bed features & min assist for sit<>stand x 2 at EOB. Upon standing pt reports feeling "hot" & fatigued so returns to sitting & ultimately to semi fowler in bed. Pt reports feeling wiped out at this time. Pt also reports she does not have any assistance at home. At this time, pt is unsafe to d/c home alone & would benefit from STR upon d/c to maximize independence with functional mobility & reduce fall risk prior to return home. Will continue to follow pt acutely to progress independence with bed mobility, transfers, gait & stairs.   ?   ? ?Recommendations for follow up therapy are one component of a multi-disciplinary discharge planning process, led by the attending physician.  Recommendations may be updated based on patient status, additional functional criteria and insurance authorization. ? ?Follow Up Recommendations Skilled nursing-short term rehab (<3 hours/day) ? ?  ?Assistance Recommended at Discharge Frequent or constant  Supervision/Assistance  ?Patient can return home with the following ? A little help with walking and/or transfers;A little help with bathing/dressing/bathroom;Assistance with cooking/housework;Assist for transportation;Help with stairs or ramp for entrance ? ?  ?Equipment Recommendations BSC/3in1  ?Recommendations for Other Services ?    ?  ?Functional Status Assessment Patient has had a recent decline in their functional status and demonstrates the ability to make significant improvements in function in a reasonable and predictable amount of time.  ? ?  ?Precautions / Restrictions Precautions ?Precautions: Fall ?Restrictions ?Weight Bearing Restrictions: Yes ?RLE Weight Bearing: Weight bearing as tolerated  ? ?  ? ?Mobility ? Bed Mobility ?Overal bed mobility: Needs Assistance ?Bed Mobility: Supine to Sit, Sit to Supine ?  ?  ?Supine to sit: Min assist, HOB elevated (cuing for technique & sequencing, use of bed rails, HOB elevated) ?Sit to supine: Min assist, HOB elevated (assistance to elevate RLE onto bed) ?  ?  ?  ? ?Transfers ?Overall transfer level: Needs assistance ?Equipment used: Rolling walker (2 wheels) ?Transfers: Sit to/from Stand ?Sit to Stand: Min assist ?  ?  ?  ?  ?  ?General transfer comment: cuing for safe hand placement, STS x 2 from EOB ?  ? ?Ambulation/Gait ?  ?  ?  ?  ?  ?  ?  ?  ? ?Stairs ?  ?  ?  ?  ?  ? ?Wheelchair Mobility ?  ? ?Modified Rankin (Stroke Patients Only) ?  ? ?  ? ?Balance Overall balance assessment: Needs assistance ?Sitting-balance support: Feet supported, Bilateral upper extremity supported ?  Sitting balance-Leahy Scale: Fair ?  ?  ?Standing balance support: Bilateral upper extremity supported, During functional activity, Reliant on assistive device for balance ?Standing balance-Leahy Scale: Poor ?  ?  ?  ?  ?  ?  ?  ?  ?  ?  ?  ?  ?   ? ? ? ?Pertinent Vitals/Pain Pain Assessment ?Pain Assessment: 0-10 ?Pain Score: 5  ?Pain Location: RLE ?Pain Descriptors / Indicators:  Discomfort ?Pain Intervention(s): Monitored during session, Patient requesting pain meds-RN notified  ? ? ?Home Living Family/patient expects to be discharged to:: Private residence ?Living Arrangements: Alone ?Available Help at Discharge: Family;Available PRN/intermittently ?Type of Home: House ?Home Access: Stairs to enter ?Entrance Stairs-Rails: Right;Left;Can reach both ?Entrance Stairs-Number of Steps: 3 ?  ?Home Layout: One level ?Home Equipment: Conservation officer, nature (2 wheels) ?   ?  ?Prior Function   ?  ?  ?  ?  ?  ?  ?Mobility Comments: Prior to fall in Feb. 2023 pt was independent without AD, driving, cooking, cleaning. Has been using a RW in the recent past 2/2 fall & R hip pain. ?  ?  ? ? ?Hand Dominance  ?   ? ?  ?Extremity/Trunk Assessment  ? Upper Extremity Assessment ?Upper Extremity Assessment: Overall WFL for tasks assessed ?  ? ?Lower Extremity Assessment ?Lower Extremity Assessment: Generalized weakness;RLE deficits/detail ?RLE Deficits / Details: 3-/5 knee extension in sitting, limited by R hip pain ?  ? ?   ?Communication  ? Communication: No difficulties  ?Cognition Arousal/Alertness: Awake/alert ?Behavior During Therapy: [facility] for tasks assessed/performed ?Overall Cognitive Status: Within Functional Limits for tasks assessed ?  ?  ?  ?  ?  ?  ?  ?  ?  ?  ?  ?  ?  ?  ?  ?  ?General Comments: Pleasant lady ?  ?  ? ?  ?General Comments   ? ?  ?Exercises General Exercises - Lower Extremity ?Ankle Circles/Pumps: AROM, Right, 10 reps, Supine ?Quad Sets: AROM, Strengthening, Supine, Right, 10 reps ?Gluteal Sets: AROM, 10 reps, Strengthening, Supine ?Short Arc Quad: AROM, Strengthening, Supine, Right, 10 reps ?Long Arc Quad: AROM, Seated, Right, Strengthening, 10 reps ?Heel Slides: AAROM, Supine, Strengthening, Right, 10 reps ?Hip ABduction/ADduction: AAROM, Strengthening, AROM, 10 reps, Supine (hip abduction slides x 10 with AAROM, hip adduction pillow squeezes x 10) ?Straight Leg Raises: AAROM,  Strengthening, Supine, Right, 10 reps ?Hip Flexion/Marching: AROM, Strengthening, Seated, Right, 10 reps  ? ?Assessment/Plan  ?  ?PT Assessment Patient needs continued PT services  ?PT Problem List Decreased strength;Decreased mobility;Decreased safety awareness;Decreased activity tolerance;Decreased balance;Cardiopulmonary status limiting activity;Pain;Decreased knowledge of use of DME ? ?   ?  ?PT Treatment Interventions DME instruction;Therapeutic exercise;Gait training;Balance training;Stair training;Neuromuscular re-education;Functional mobility training;Manual techniques;Modalities;Therapeutic activities;Patient/family education   ? ?PT Goals (Current goals can be found in the Care Plan section)  ?Acute Rehab PT Goals ?Patient Stated Goal: get better, return to PLOF ?PT Goal Formulation: With patient ?Time For Goal Achievement: 02/16/22 ?Potential to Achieve Goals: Good ? ?  ?Frequency BID ?  ? ? ?Co-evaluation   ?  ?  ?  ?  ? ? ?  ?AM-PAC PT "6 Clicks" Mobility  ?Outcome Measure Help needed turning from your back to your side while in a flat bed without using bedrails?: A Little ?Help needed moving from lying on your back to sitting on the side of a flat bed without using bedrails?: A Little ?Help needed moving to and from  a bed to a chair (including a wheelchair)?: A Little ?Help needed standing up from a chair using your arms (e.g., wheelchair or bedside chair)?: A Little ?Help needed to walk in hospital room?: A Lot ?Help needed climbing 3-5 steps with a railing? : A Lot ?6 Click Score: 16 ? ?  ?End of Session Equipment Utilized During Treatment: Gait belt ?Activity Tolerance: Patient limited by fatigue ?Patient left: in bed;with call bell/phone within reach;with bed alarm set;with family/visitor present ?Nurse Communication: Mobility status ?PT Visit Diagnosis: Muscle weakness (generalized) (M62.81);Difficulty in walking, not elsewhere classified (R26.2);Pain ?Pain - Right/Left: Right ?Pain - part of  body: Hip ?  ? ?Time: [phone] ?PT Time Calculation (min) (ACUTE ONLY): 30 min ? ? ?Charges:   PT Evaluation ?$PT Eval Low Complexity: 1 Low ?PT Treatments ?$Therapeutic Exercise: 8-22 mins ?  ?   ? ? ?Lavone Nian, PT

## 2022-02-02 NOTE — Progress Notes (Signed)
Physical Therapy Treatment ?Patient Details ?Name: Tracy Harvey ?MRN: [id number] ?DOB: [birth year] ?Today's Date: 02/02/2022 ? ? ?History of Present Illness Pt is 76 y/o F admitted on 02/01/22 after presenting to the ER from the outpatient ortho clinic for evaluation of R hip pain following a fall. Pt fell about 6 weeks ago & again the morning of admission. X-ray of the right hip shows subtle subacute nondisplaced fracture of the R subtrochanteric femur. Pt underwent surgery for R IM nail placement on 02/01/22. PMH: OA, HTN, GERD, asthma, hiatal hernia, IBS ? ?  ?PT Comments  ? ? Pt seen for PT evaluation with pt reporting need to use restroom. Pt is able to complete supine>sit with supervision but extra time, multiple attempts & reliance on hospital bed features. Pt completes sit>stand & stand pivot with min assist with RW. Pt elects to minimally weight bear through RLE & scoots LLE across floor vs stepping. Once on [facility] PT observed IV to be bleeding & nurse called to room to assist. Pt with continent void on [facility] & reports feeling unwell, hot & nauseous. Pt assisted to standing & BSC replaced with recliner & pt left in recliner in care of nurse.  ? ?Vitals when feeling unwell on BSC ?BP in RUE: 120/71 mmHg MAP 85, HR 48 bpm ?   ?Recommendations for follow up therapy are one component of a multi-disciplinary discharge planning process, led by the attending physician.  Recommendations may be updated based on patient status, additional functional criteria and insurance authorization. ? ?Follow Up Recommendations ? Skilled nursing-short term rehab (<3 hours/day) ?  ?  ?Assistance Recommended at Discharge Frequent or constant Supervision/Assistance  ?Patient can return home with the following A little help with bathing/dressing/bathroom;Assistance with cooking/housework;Assist for transportation;Help with stairs or ramp for entrance;A lot of help with walking and/or transfers ?  ?Equipment Recommendations ? BSC/3in1  ?   ?Recommendations for Other Services   ? ? ?  ?Precautions / Restrictions Precautions ?Precautions: Fall ?Restrictions ?Weight Bearing Restrictions: Yes ?RLE Weight Bearing: Weight bearing as tolerated  ?  ? ?Mobility ? Bed Mobility ?Overal bed mobility: Needs Assistance ?Bed Mobility: Supine to Sit ?  ?  ?Supine to sit: Supervision, HOB elevated (extra time & multiple attempts to successfully transition to sitting EOB with HOB elevated & bed rails) ?Sit to supine: Min assist, HOB elevated (assistance to elevate RLE onto bed) ?  ?  ?  ? ?Transfers ?Overall transfer level: Needs assistance ?Equipment used: Rolling walker (2 wheels) ?Transfers: Sit to/from Stand, Bed to chair/wheelchair/BSC ?Sit to Stand: Min assist (cuing for safe hand placement as pt with poor recall from AM session) ?  ?Step pivot transfers: Min assist (Pt completes bed>BSC transfer with RW & min assist, PT educates pt on ability to weight bear through RLE but pt elects to step with R foot, minimally weight bear through RLE & scoot LLE across floor vs stepping with LLE) ?  ?  ?  ?General transfer comment: cuing for safe hand placement, STS x 2 from EOB ?  ? ?Ambulation/Gait ?  ?  ?  ?  ?  ?  ?  ?  ? ? ?Stairs ?  ?  ?  ?  ?  ? ? ?Wheelchair Mobility ?  ? ?Modified Rankin (Stroke Patients Only) ?  ? ? ?  ?Balance Overall balance assessment: Needs assistance ?Sitting-balance support: Feet supported, Bilateral upper extremity supported ?Sitting balance-Leahy Scale: Fair ?Sitting balance - Comments: supervision static sitting EOB ?  ?  Standing balance support: Bilateral upper extremity supported, During functional activity, Reliant on assistive device for balance ?Standing balance-Leahy Scale: Poor ?  ?  ?  ?  ?  ?  ?  ?  ?  ?  ?  ?  ?  ? ?  ?Cognition Arousal/Alertness: Awake/alert ?Behavior During Therapy: [facility] for tasks assessed/performed ?Overall Cognitive Status: Within Functional Limits for tasks assessed ?  ?  ?  ?  ?  ?  ?  ?  ?  ?  ?  ?  ?  ?  ?   ?  ?General Comments: Pleasant lady ?  ?  ? ?  ?Exercises General Exercises - Lower Extremity ?Ankle Circles/Pumps: AROM, Right, 10 reps, Supine ?Quad Sets: AROM, Strengthening, Supine, Right, 10 reps ?Gluteal Sets: AROM, 10 reps, Strengthening, Supine ?Short Arc Quad: AROM, Strengthening, Supine, Right, 10 reps ?Long Arc Quad: AROM, Seated, Right, Strengthening, 10 reps ?Heel Slides: AAROM, Supine, Strengthening, Right, 10 reps ?Hip ABduction/ADduction: AAROM, Strengthening, AROM, 10 reps, Supine (hip abduction slides x 10 with AAROM, hip adduction pillow squeezes x 10) ?Straight Leg Raises: AAROM, Strengthening, Supine, Right, 10 reps ?Hip Flexion/Marching: AROM, Strengthening, Seated, Right, 10 reps ? ?  ?General Comments General comments (skin integrity, edema, etc.): Pt with continent void on BSC, IV bleeding during session & nurse called to room & addressed it. ?  ?  ? ?Pertinent Vitals/Pain Pain Assessment ?Pain Assessment: Faces ?Pain Score: 5  ?Faces Pain Scale: Hurts little more ?Pain Location: RLE ?Pain Descriptors / Indicators: Discomfort ?Pain Intervention(s): Monitored during session, Repositioned  ? ? ?Home Living   ?  ?  ?  ?  ?  ?  ?  ?  ?  ?   ?  ?Prior Function    ?  ?  ?   ? ?PT Goals (current goals can now be found in the care plan section) Acute Rehab PT Goals ?Patient Stated Goal: get better, return to PLOF ?PT Goal Formulation: With patient ?Time For Goal Achievement: 02/16/22 ?Potential to Achieve Goals: Good ?Progress towards PT goals: Progressing toward goals ? ?  ?Frequency ? ? ? BID ? ? ? ?  ?PT Plan Current plan remains appropriate  ? ? ?Co-evaluation   ?  ?  ?  ?  ? ?  ?AM-PAC PT "6 Clicks" Mobility   ?Outcome Measure ? Help needed turning from your back to your side while in a flat bed without using bedrails?: A Little ?Help needed moving from lying on your back to sitting on the side of a flat bed without using bedrails?: A Little ?Help needed moving to and from a bed to a chair  (including a wheelchair)?: A Little ?Help needed standing up from a chair using your arms (e.g., wheelchair or bedside chair)?: A Little ?Help needed to walk in hospital room?: A Lot ?Help needed climbing 3-5 steps with a railing? : A Lot ?6 Click Score: 16 ? ?  ?End of Session Equipment Utilized During Treatment: Gait belt ?Activity Tolerance: Patient limited by fatigue (feeling unwell) ?Patient left: in chair;with chair alarm set;with nursing/sitter in room ?Nurse Communication: Mobility status (IV bleeding) ?PT Visit Diagnosis: Muscle weakness (generalized) (M62.81);Difficulty in walking, not elsewhere classified (R26.2);Pain ?Pain - Right/Left: Right ?Pain - part of body: Hip ?  ? ? ?Time: 8828-0034 ?PT Time Calculation (min) (ACUTE ONLY): 26 min ? ?Charges:  $Therapeutic Activity: 23-37 mins          ?          ? ?  Lavone Nian, PT, DPT ?02/02/22, 2:46 PM ? ? ? ?Waunita Schooner ?02/02/2022, 2:44 PM ? ?

## 2022-02-02 NOTE — Discharge Instructions (Signed)
INSTRUCTIONS AFTER Surgery ? ?Remove items at home which could result in a fall. This includes throw rugs or furniture in walking pathways ?ICE to the affected joint every three hours while awake for 30 minutes at a time, for at least the first 3-5 days, and then as needed for pain and swelling.  Continue to use ice for pain and swelling. You may notice swelling that will progress down to the foot and ankle.  This is normal after surgery.  Elevate your leg when you are not up walking on it.   ?Continue to use the breathing machine you got in the hospital (incentive spirometer) which will help keep your temperature down.  It is common for your temperature to cycle up and down following surgery, especially at night when you are not up moving around and exerting yourself.  The breathing machine keeps your lungs expanded and your temperature down. ? ? ?DIET:  As you were doing prior to hospitalization, we recommend a well-balanced diet. ? ?DRESSING / WOUND CARE / SHOWERING ? ?Dressing change as needed.  Staples will be removed in 2 weeks at [facility] clinic orthopedics. ? ?ACTIVITY ? ?Increase activity slowly as tolerated, but follow the weight bearing instructions below.   ?No driving for 6 weeks or until further direction given by your physician.  You cannot drive while taking narcotics.  ?No lifting or carrying greater than 10 lbs. until further directed by your surgeon. ?Avoid periods of inactivity such as sitting longer than an hour when not asleep. This helps prevent blood clots.  ?You may return to work once you are authorized by your doctor.  ? ? ? ?WEIGHT BEARING  ?Weightbearing as tolerated on the right. ? ? ?EXERCISES ?Gait training and ambulation training with physical therapy.  Activities of daily living with occupational therapy ? ?CONSTIPATION ? ?Constipation is defined medically as fewer than three stools per week and severe constipation as less than one stool per week.  Even if you have a regular bowel  pattern at home, your normal regimen is likely to be disrupted due to multiple reasons following surgery.  Combination of anesthesia, postoperative narcotics, change in appetite and fluid intake all can affect your bowels.  ? ?YOU MUST use at least one of the following options; they are listed in order of increasing strength to get the job done.  They are all available over the counter, and you may need to use some, POSSIBLY even all of these options:   ? ?Drink plenty of fluids (prune juice may be helpful) and high fiber foods ?Colace 100 mg by mouth twice a day  ?Senokot for constipation as directed and as needed Dulcolax (bisacodyl), take with full glass of water  ?Miralax (polyethylene glycol) once or twice a day as needed. ? ?If you have tried all these things and are unable to have a bowel movement in the first 3-4 days after surgery call either your surgeon or your primary doctor.   ? ?If you experience loose stools or diarrhea, hold the medications until you stool forms back up.  If your symptoms do not get better within 1 week or if they get worse, check with your doctor.  If you experience "the worst abdominal pain ever" or develop nausea or vomiting, please contact the office immediately for further recommendations for treatment. ? ? ?ITCHING:  If you experience itching with your medications, try taking only a single pain pill, or even half a pain pill at a time.  You can  also use Benadryl over the counter for itching or also to help with sleep.  ? ?TED HOSE STOCKINGS:  Use stockings on both legs until for at least 2 weeks or as directed by physician office. They may be removed at night for sleeping. ? ?MEDICATIONS:  See your medication summary on the ?After Visit Summary? that nursing will review with you.  You may have some home medications which will be placed on hold until you complete the course of blood thinner medication.  It is important for you to complete the blood thinner medication as  prescribed. ? ?PRECAUTIONS:  If you experience chest pain or shortness of breath - call 911 immediately for transfer to the hospital emergency department.  ? ?If you develop a fever greater that 101 F, purulent drainage from wound, increased redness or drainage from wound, foul odor from the wound/dressing, or calf pain - CONTACT YOUR SURGEON.   ?                                                ?FOLLOW-UP APPOINTMENTS:  If you do not already have a post-op appointment, please call the office for an appointment to be seen by your surgeon.  Guidelines for how soon to be seen are listed in your ?After Visit Summary?, but are typically between 1-4 weeks after surgery. ? ?OTHER INSTRUCTIONS:  ? ? ? ?MAKE SURE YOU:  ?Understand these instructions.  ?Get help right away if you are not doing well or get worse.  ? ? ?Thank you for letting us be a part of your medical care team.  It is a privilege we respect greatly.  We hope these instructions will help you stay on track for a fast and full recovery!  ? ?

## 2022-02-02 NOTE — Anesthesia Postprocedure Evaluation (Signed)
Anesthesia Post Note ? ?Patient: SULEIKA DONAVAN ? ?Procedure(s) Performed: INTRAMEDULLARY (IM) NAIL INTERTROCHANTRIC (Right: Hip) ? ?Patient location during evaluation: Nursing Unit ?Anesthesia Type: Spinal ?Level of consciousness: awake, awake and alert and oriented ?Pain management: pain level controlled ?Vital Signs Assessment: post-procedure vital signs reviewed and stable ?Respiratory status: spontaneous breathing and nonlabored ventilation ?Cardiovascular status: blood pressure returned to baseline ?Postop Assessment: no headache and no backache ?Anesthetic complications: no ? ? ?No notable events documented. ? ? ?Last Vitals:  ?Vitals:  ? 02/02/22 0313 02/02/22 0820  ?BP: (!) 156/70 (!) 153/66  ?Pulse: 60 83  ?Resp: 16 17  ?Temp: 36.9 ?C 36.8 ?C  ?SpO2: 93% 95%  ?  ?Last Pain:  ?Vitals:  ? 02/02/22 1000  ?TempSrc:   ?PainSc: 7   ? ? ?  ?  ?  ?  ?  ?  ? ?Deno Etienne ? ? ? ? ?

## 2022-02-02 NOTE — Hospital Course (Addendum)
Tracy Harvey is a 76 year old female with PMH asthma, GERD, HTN, IBS, osteoarthritis who presented to the hospital from outpatient orthopedic clinic after a mechanical fall from tripping on her rolling walker.  She had fallen approximately 6 weeks ago on 01/20/2022 and had persistent right-sided hip pain. ?Imaging on admission showed subtle subacute nondisplaced fracture of the right subtrochanteric femur.  Orthopedic surgery was consulted and she was taken to the OR for surgical repair on 02/01/2022. ?See below for further problem-based plan. ?

## 2022-02-03 DIAGNOSIS — S7224XA Nondisplaced subtrochanteric fracture of right femur, initial encounter for closed fracture: Secondary | ICD-10-CM | POA: Diagnosis not present

## 2022-02-03 DIAGNOSIS — E871 Hypo-osmolality and hyponatremia: Secondary | ICD-10-CM | POA: Diagnosis not present

## 2022-02-03 LAB — BASIC METABOLIC PANEL
Anion gap: 8 (ref 5–15)
Anion gap: 5 (ref 5–15)
BUN: 13 mg/dL (ref 8–23)
BUN: 14 mg/dL (ref 8–23)
CO2: 23 mmol/L (ref 22–32)
CO2: 25 mmol/L (ref 22–32)
Calcium: 8.6 mg/dL — ABNORMAL LOW (ref 8.9–10.3)
Calcium: 8.2 mg/dL — ABNORMAL LOW (ref 8.9–10.3)
Chloride: 96 mmol/L — ABNORMAL LOW (ref 98–111)
Chloride: 97 mmol/L — ABNORMAL LOW (ref 98–111)
Creatinine, Ser: 0.74 mg/dL (ref 0.44–1.00)
Creatinine, Ser: 0.89 mg/dL (ref 0.44–1.00)
GFR, Estimated: >60 mL/min (ref >60)
GFR, Estimated: >60 mL/min (ref >60)
Glucose, Bld: 94 mg/dL (ref 70–99)
Glucose, Bld: 125 mg/dL — ABNORMAL HIGH (ref 70–99)
Potassium: 2.8 mmol/L — ABNORMAL LOW (ref 3.5–5.1)
Potassium: 3.5 mmol/L (ref 3.5–5.1)
Sodium: 127 mmol/L — ABNORMAL LOW (ref 135–145)
Sodium: 127 mmol/L — ABNORMAL LOW (ref 135–145)

## 2022-02-03 LAB — SODIUM, URINE, RANDOM: Sodium, Ur: 51 mmol/L

## 2022-02-03 LAB — CBC WITH DIFFERENTIAL/PLATELET
Abs Immature Granulocytes: 0.05 10*3/uL (ref 0.00–0.07)
Basophils Absolute: 0.1 10*3/uL (ref 0.0–0.1)
Basophils Relative: 1 %
Eosinophils Absolute: 0.1 10*3/uL (ref 0.0–0.5)
Eosinophils Relative: 1 %
HCT: 30.5 % — ABNORMAL LOW (ref 36.0–46.0)
Hemoglobin: 10.2 g/dL — ABNORMAL LOW (ref 12.0–15.0)
Immature Granulocytes: 1 %
Lymphocytes Relative: 14 %
Lymphs Abs: 1.4 10*3/uL (ref 0.7–4.0)
MCH: 30.4 pg (ref 26.0–34.0)
MCHC: 33.4 g/dL (ref 30.0–36.0)
MCV: 91 fL (ref 80.0–100.0)
Monocytes Absolute: 0.8 10*3/uL (ref 0.1–1.0)
Monocytes Relative: 8 %
Neutro Abs: 7.5 10*3/uL (ref 1.7–7.7)
Neutrophils Relative %: 75 %
Platelets: 281 10*3/uL (ref 150–400)
RBC: 3.35 MIL/uL — ABNORMAL LOW (ref 3.87–5.11)
RDW: 13.4 % (ref 11.5–15.5)
WBC: 9.8 10*3/uL (ref 4.0–10.5)
nRBC: 0 % (ref 0.0–0.2)

## 2022-02-03 LAB — CORTISOL: Cortisol, Plasma: 3.9 ug/dL

## 2022-02-03 LAB — OSMOLALITY, URINE: Osmolality, Ur: 305 mOsm/kg (ref 300–900)

## 2022-02-03 LAB — CREATININE, URINE, RANDOM: Creatinine, Urine: 39 mg/dL

## 2022-02-03 LAB — OSMOLALITY: Osmolality: 272 mOsm/kg — ABNORMAL LOW (ref 275–295)

## 2022-02-03 LAB — MAGNESIUM: Magnesium: 1.9 mg/dL (ref 1.7–2.4)

## 2022-02-03 LAB — TSH: TSH: 1.328 u[IU]/mL (ref 0.350–4.500)

## 2022-02-03 MED ORDER — POTASSIUM CHLORIDE CRYS ER 20 MEQ PO TBCR
40.0000 meq | EXTENDED_RELEASE_TABLET | ORAL | Status: AC
  Administered 2022-02-03 (×3): 40 meq via ORAL
  Filled 2022-02-03 (×3): qty 2

## 2022-02-03 MED ORDER — ENSURE ENLIVE PO LIQD
237.0000 mL | Freq: Three times a day (TID) | ORAL | Status: DC
  Administered 2022-02-03 – 2022-02-04 (×4): 237 mL via ORAL

## 2022-02-03 MED ORDER — MAGNESIUM SULFATE 2 GM/50ML IV SOLN
2.0000 g | Freq: Once | INTRAVENOUS | Status: AC
Start: 2022-02-03 — End: 2022-02-03
  Administered 2022-02-03: 2 g via INTRAVENOUS
  Filled 2022-02-03: qty 50

## 2022-02-03 NOTE — Progress Notes (Signed)
She ?Progress Note ? ? ? ?Tracy Harvey   ?LSL:[id number]  ?DOB: 06-Mar-1946  ?DOA: 02/01/2022     2 ?PCP: Rusty Aus, MD ? ?Initial CC: fall at home ? ?Hospital Course: ?Tracy Harvey is a 76 year old female with PMH asthma, GERD, HTN, IBS, osteoarthritis who presented to the hospital from outpatient orthopedic clinic after a mechanical fall from tripping on her rolling walker.  She had fallen approximately 6 weeks ago on 01/20/2022 and had persistent right-sided hip pain. ?Imaging on admission showed subtle subacute nondisplaced fracture of the right subtrochanteric femur.  Orthopedic surgery was consulted and she was taken to the OR for surgical repair on 02/01/2022. ? ?Interval History:  ?No events overnight.  Sitting up in recliner this morning.  Walked some with physical therapy and left the room.  Tentative plan remains for going to SNF at discharge. ? ?Assessment and Plan: ?* Nondisplaced subtrochanteric fracture of right femur, initial encounter for closed fracture ([place]) ?- s/p mechanical fall with a nondisplaced subtrochanteric fracture of right femur ?- s/p reduction and internal fixation with orthopedic surgery on 02/01/2022 ?- Continue working with PT ?- Continue pain control ?- Weightbearing as tolerated to right lower extremity ?-Tentative plan for discharging to SNF ? ?Hyponatremia ?- suspected due to HCTZ and possibly some volume depletion  ?- Hold hydrochlorothiazide ?- continue IVF  ?- trend BMP ? ?Hypokalemia ?- Replete as needed ? ?Leukocytosis ?- Low suspicion for underlying infection ?- Likely due to reactive process ? ?Essential hypertension ?- Continue amlodipine and verapamil ? ?GERD (gastroesophageal reflux disease) ?- Continue pantoprazole ? ? ?Old records reviewed in assessment of this patient ? ?Antimicrobials: ? ? ?DVT prophylaxis:  ?enoxaparin (LOVENOX) injection 40 mg Start: 02/02/22 0800 ?SCDs Start: 02/01/22 2005 ? ? ?Code Status:   Code Status: Full Code ? ?Disposition Plan:  SNF ?Status  is: Inpt ? ?Objective: ?Blood pressure (!) 141/78, pulse 96, temperature 98.5 ?F (36.9 ?C), resp. rate 18, height 5' (1.524 m), weight 64.9 kg, SpO2 94 %.  ?Examination:  ?Physical Exam ?Constitutional:   ?   General: She is not in acute distress. ?   Appearance: Normal appearance.  ?HENT:  ?   Head: Normocephalic and atraumatic.  ?   Mouth/Throat:  ?   Mouth: Mucous membranes are moist.  ?Eyes:  ?   Extraocular Movements: Extraocular movements intact.  ?Cardiovascular:  ?   Rate and Rhythm: Normal rate and regular rhythm.  ?Pulmonary:  ?   Effort: Pulmonary effort is normal.  ?   Breath sounds: Normal breath sounds.  ?Abdominal:  ?   General: Bowel sounds are normal. There is no distension.  ?   Palpations: Abdomen is soft.  ?   Tenderness: There is no abdominal tenderness.  ?Musculoskeletal:  ?   Cervical back: Normal range of motion and neck supple.  ?   Comments: Right surgical dressings in place.  Compartments soft.  ?Skin: ?   General: Skin is warm and dry.  ?Neurological:  ?   General: No focal deficit present.  ?   Mental Status: She is alert.  ?Psychiatric:     ?   Mood and Affect: Mood normal.     ?   Behavior: Behavior normal.  ?  ? ?Consultants:  ?Orthopedic surgery ? ?Procedures:  ?Reduction and internal fixation of nondisplaced subtrochanteric right hip fracture, 02/01/2022 ? ?Data Reviewed: ?Results for orders placed or performed during the hospital encounter of 02/01/22 (from the past 24 hour(s))  ?Basic metabolic panel  Status: Abnormal  ? Collection Time: 02/03/22  4:06 AM  ?Result Value Ref Range  ? Sodium 127 (L) 135 - 145 mmol/L  ? Potassium 2.8 (L) 3.5 - 5.1 mmol/L  ? Chloride 96 (L) 98 - 111 mmol/L  ? CO2 23 22 - 32 mmol/L  ? Glucose, Bld 94 70 - 99 mg/dL  ? BUN 13 8 - 23 mg/dL  ? Creatinine, Ser 0.74 0.44 - 1.00 mg/dL  ? Calcium 8.6 (L) 8.9 - 10.3 mg/dL  ? GFR, Estimated >60 >60 mL/min  ? Anion gap 8 5 - 15  ?CBC with Differential/Platelet     Status: Abnormal  ? Collection Time: 02/03/22   4:06 AM  ?Result Value Ref Range  ? WBC 9.8 4.0 - 10.5 K/uL  ? RBC 3.35 (L) 3.87 - 5.11 MIL/uL  ? Hemoglobin 10.2 (L) 12.0 - 15.0 g/dL  ? HCT 30.5 (L) 36.0 - 46.0 %  ? MCV 91.0 80.0 - 100.0 fL  ? MCH 30.4 26.0 - 34.0 pg  ? MCHC 33.4 30.0 - 36.0 g/dL  ? RDW 13.4 11.5 - 15.5 %  ? Platelets 281 150 - 400 K/uL  ? nRBC 0.0 0.0 - 0.2 %  ? Neutrophils Relative % 75 %  ? Neutro Abs 7.5 1.7 - 7.7 K/uL  ? Lymphocytes Relative 14 %  ? Lymphs Abs 1.4 0.7 - 4.0 K/uL  ? Monocytes Relative 8 %  ? Monocytes Absolute 0.8 0.1 - 1.0 K/uL  ? Eosinophils Relative 1 %  ? Eosinophils Absolute 0.1 0.0 - 0.5 K/uL  ? Basophils Relative 1 %  ? Basophils Absolute 0.1 0.0 - 0.1 K/uL  ? Immature Granulocytes 1 %  ? Abs Immature Granulocytes 0.05 0.00 - 0.07 K/uL  ?Magnesium     Status: None  ? Collection Time: 02/03/22  4:06 AM  ?Result Value Ref Range  ? Magnesium 1.9 1.7 - 2.4 mg/dL  ?TSH     Status: None  ? Collection Time: 02/03/22  7:50 AM  ?Result Value Ref Range  ? TSH 1.328 0.350 - 4.500 uIU/mL  ?Sodium, urine, random     Status: None  ? Collection Time: 02/03/22 12:47 PM  ?Result Value Ref Range  ? Sodium, Ur 51 mmol/L  ?Creatinine, urine, random     Status: None  ? Collection Time: 02/03/22 12:47 PM  ?Result Value Ref Range  ? Creatinine, Urine 39 mg/dL  ?  ?I have Reviewed nursing notes, Vitals, and Lab results since pt's last encounter. Pertinent lab results : see above ?I have ordered test including BMP, CBC, Mg ?I have reviewed the last note from staff over past 24 hours ?I have discussed pt's care plan and test results with nursing staff, case manager ? ? LOS: 2 days  ? ?Dwyane Dee, MD ?[facility] ?02/03/2022, 1:18 PM ? ?

## 2022-02-03 NOTE — NC FL2 (Signed)
?[place] MEDICAID FL2 LEVEL OF CARE SCREENING TOOL  ?  ? ?IDENTIFICATION  ?Patient Name: ?Tracy Harvey Birthdate: 1946-04-29 Sex: female Admission Date (Current Location): ?02/01/2022  ?[place] and [place] Number: ?  ?  Facility and Address:  ?[facility], [address] [place], [place] [address] ?     Provider Number: ?[id number]  ?Attending Physician Name and Address:  ?Dwyane Dee, MD ? Relative Name and Phone Number:  ?Tracy Harvey (Daughter)   [phone] (Mobile) ?   ?Current Level of Care: ?Hospital Recommended Level of Care: ?[place] Prior Approval Number: ?  ? ?Date Approved/Denied: ?02/03/22 PASRR Number: ?[id number] A. ? ?Discharge Plan: ?SNF ?  ? ?Current Diagnoses: ?Patient Active Problem List  ? Diagnosis Date Noted  ? Hypokalemia 02/02/2022  ? Nondisplaced subtrochanteric fracture of right femur, initial encounter for closed fracture ([place]) 02/01/2022  ? Hyponatremia 02/01/2022  ? GERD (gastroesophageal reflux disease) 02/01/2022  ? Essential hypertension 02/01/2022  ? Leukocytosis 02/01/2022  ? ? ?Orientation RESPIRATION BLADDER Height & Weight   ?  ?Self, Time, Situation, Place ? Normal Continent Weight: 64.9 kg ?Height:  5' (152.4 cm)  ?BEHAVIORAL SYMPTOMS/MOOD NEUROLOGICAL BOWEL NUTRITION STATUS  ?    Continent Diet  ?AMBULATORY STATUS COMMUNICATION OF NEEDS Skin   ?Limited Assist Verbally Surgical wounds ?  ?  ?  ?    ?     ?     ? ? ?Personal Care Assistance Level of Assistance  ?Bathing, Dressing (Feeding set up) Bathing Assistance: Maximum assistance ?  ?Dressing Assistance: Maximum assistance ?   ? ?Functional Limitations Info  ?Sight (Glasses) Sight Info: Adequate (With glasses) ?  ?   ? ? ?SPECIAL CARE FACTORS FREQUENCY  ?PT (By licensed PT), OT (By licensed OT)   ?  ?PT Frequency: 5x/week ?OT Frequency: 5x/week ?  ?  ?  ?   ? ? ?Contractures Contractures Info: Not present  ? ? ?Additional Factors Info  ?Code Status, Allergies  Code Status Info: Full Code ?Allergies Info: Augmentin, chocolate, eggs and egg products,  morphine and derivatives, tilactase, erythromycin. Please see chart for any changes. ?  ?  ?  ?   ? ?Current Medications (02/03/2022):  This is the current hospital active medication list ?Current Facility-Administered Medications  ?Medication Dose Route Frequency Provider Last Rate Last Admin  ? 0.9 %  sodium chloride infusion   Intravenous Continuous Dwyane Dee, MD 75 mL/hr at 02/03/22 0537 Infusion Verify at 02/03/22 0537  ? acetaminophen (TYLENOL) tablet 325-650 mg  325-650 mg Oral Q6H PRN Poggi, Marshall Cork, MD      ? albuterol (PROVENTIL) (2.5 MG/3ML) 0.083% nebulizer solution 3 mL  3 mL Inhalation Q6H PRN Poggi, Marshall Cork, MD      ? amLODipine (NORVASC) tablet 5 mg  5 mg Oral Daily Poggi, Marshall Cork, MD   5 mg at 02/03/22 9390  ? ascorbic acid (VITAMIN C) tablet 500 mg  500 mg Oral Daily Poggi, Marshall Cork, MD   500 mg at 02/03/22 3009  ? bisacodyl (DULCOLAX) suppository 10 mg  10 mg Rectal Daily PRN Poggi, Marshall Cork, MD      ? calcium-vitamin D (OSCAL WITH D) 500-5 MG-MCG per tablet 1 tablet  1 tablet Oral TID Poggi, Marshall Cork, MD   1 tablet at 02/03/22 2330  ? diphenhydrAMINE (BENADRYL) 12.5 MG/5ML elixir 12.5-25 mg  12.5-25 mg Oral Q4H PRN Poggi, Marshall Cork, MD      ? docusate sodium (COLACE) capsule 100  mg  100 mg Oral BID Corky Mull, MD   100 mg at 02/03/22 6063  ? enoxaparin (LOVENOX) injection 40 mg  40 mg Subcutaneous Q24H Poggi, Marshall Cork, MD   40 mg at 02/03/22 0160  ? estradiol (ESTRACE) tablet 1 mg  1 mg Oral QPC breakfast Poggi, Marshall Cork, MD   1 mg at 02/03/22 1093  ? feeding supplement (ENSURE ENLIVE / ENSURE PLUS) liquid 237 mL  237 mL Oral TID BM Dwyane Dee, MD   237 mL at 02/03/22 1421  ? HYDROcodone-acetaminophen (NORCO/VICODIN) 5-325 MG per tablet 1-2 tablet  1-2 tablet Oral Q6H PRN Poggi, Marshall Cork, MD   2 tablet at 02/03/22 1426  ? magnesium hydroxide (MILK OF MAGNESIA) suspension 30 mL  30 mL Oral Daily PRN Poggi, Marshall Cork,  MD      ? methocarbamol (ROBAXIN) tablet 500 mg  500 mg Oral Q6H PRN Poggi, Marshall Cork, MD   500 mg at 02/03/22 1426  ? Or  ? methocarbamol (ROBAXIN) 500 mg in dextrose 5 % 50 mL IVPB  500 mg Intravenous Q6H PRN Poggi, Marshall Cork, MD      ? metoCLOPramide (REGLAN) tablet 5-10 mg  5-10 mg Oral Q8H PRN Poggi, Marshall Cork, MD      ? Or  ? metoCLOPramide (REGLAN) injection 5-10 mg  5-10 mg Intravenous Q8H PRN Poggi, Marshall Cork, MD      ? mometasone-formoterol (DULERA) 200-5 MCG/ACT inhaler 2 puff  2 puff Inhalation BID Poggi, Marshall Cork, MD   2 puff at 02/03/22 [phone]  ? montelukast (SINGULAIR) tablet 10 mg  10 mg Oral QHS Poggi, Marshall Cork, MD   10 mg at 02/02/22 2018  ? morphine (PF) 2 MG/ML injection 0.5 mg  0.5 mg Intravenous Q2H PRN Poggi, Marshall Cork, MD      ? multivitamin with minerals tablet 1 tablet  1 tablet Oral Daily Poggi, Marshall Cork, MD   1 tablet at 02/03/22 7322  ? ondansetron (ZOFRAN) tablet 4 mg  4 mg Oral Q6H PRN Poggi, Marshall Cork, MD   4 mg at 02/02/22 1353  ? Or  ? ondansetron (ZOFRAN) injection 4 mg  4 mg Intravenous Q6H PRN Poggi, Marshall Cork, MD      ? pantoprazole (PROTONIX) EC tablet 40 mg  40 mg Oral Daily Poggi, Marshall Cork, MD   40 mg at 02/03/22 0254  ? potassium chloride SA (KLOR-CON M) CR tablet 40 mEq  40 mEq Oral Q4H Dwyane Dee, MD   40 mEq at 02/03/22 1420  ? senna (SENOKOT) tablet 8.6 mg  1 tablet Oral BID Corky Mull, MD   8.6 mg at 02/03/22 2706  ? sodium phosphate (FLEET) 7-19 GM/118ML enema 1 enema  1 enema Rectal Once PRN Poggi, Marshall Cork, MD      ? verapamil (CALAN) tablet 60 mg  60 mg Oral BID Corky Mull, MD   60 mg at 02/03/22 2376  ? ? ? ?Discharge Medications: ?Please see discharge summary for a list of discharge medications. ? ?Relevant Imaging Results: ? ?Relevant Lab Results: ? ? ?Additional Information ?EG#[id number] ? ?Izola Price, RN ? ? ? ? ?

## 2022-02-03 NOTE — Progress Notes (Signed)
Physical Therapy Treatment ?Patient Details ?Name: Tracy Harvey ?MRN: [id number] ?DOB: 07/13/1946 ?Today's Date: 02/03/2022 ? ? ?History of Present Illness Pt is 76 y/o F admitted on 02/01/22 after presenting to the ER from the outpatient ortho clinic for evaluation of R hip pain following a fall. Pt fell about 6 weeks ago & again the morning of admission. X-ray of the right hip shows subtle subacute nondisplaced fracture of the R subtrochanteric femur. Pt underwent surgery for R IM nail placement on 02/01/22. PMH: OA, HTN, GERD, asthma, hiatal hernia, IBS ? ?  ?PT Comments  ? ? Pt seen for PT tx with pt agreeable to tx. Pt performs RLE strengthening exercises with cuing for technique. Pt requires CGA for supine>sit with ongoing poor balance with task & heavy reliance on hospital bed features.  PT educates pt on stepping vs shuffling with pt slowly improving demo during session. Pt is able to progress to ambulating short distances in room with RW & min assist with impaired gait pattern as described below. Pt continues to be unsafe to d/c home alone & would benefit from STR upon d/c to maximize independence with functional mobility & reduce fall risk. ?  ?Recommendations for follow up therapy are one component of a multi-disciplinary discharge planning process, led by the attending physician.  Recommendations may be updated based on patient status, additional functional criteria and insurance authorization. ? ?Follow Up Recommendations ? Skilled nursing-short term rehab (<3 hours/day) ?  ?  ?Assistance Recommended at Discharge Frequent or constant Supervision/Assistance  ?Patient can return home with the following A little help with bathing/dressing/bathroom;Assistance with cooking/housework;Assist for transportation;Help with stairs or ramp for entrance;A little help with walking and/or transfers ?  ?Equipment Recommendations ? BSC/3in1  ?  ?Recommendations for Other Services   ? ? ?  ?Precautions / Restrictions  Precautions ?Precautions: Fall ?Restrictions ?Weight Bearing Restrictions: Yes ?RLE Weight Bearing: Weight bearing as tolerated  ?  ? ?Mobility ? Bed Mobility ?Overal bed mobility: Needs Assistance ?Bed Mobility: Supine to Sit ?  ?  ?Supine to sit: Min guard, HOB elevated ?  ?  ?General bed mobility comments: Uses momentum & heavily relies on HOB elevated & bed rails, poor balance when uprighting trunk with PT providing CGA for safety ?  ? ?Transfers ?Overall transfer level: Needs assistance ?Equipment used: Rolling walker (2 wheels) ?Transfers: Sit to/from Stand, Bed to chair/wheelchair/BSC ?Sit to Stand: Min assist, Min guard ?Stand pivot transfers: Min guard, Min assist ?Step pivot transfers: Min assist ?  ?  ?  ?General transfer comment: min cuing for safe hand placement for STS ?  ? ?Ambulation/Gait ?Ambulation/Gait assistance: Min assist ?Gait Distance (Feet):  (5 + 10 ft) ?Assistive device: Rolling walker (2 wheels) ?Gait Pattern/deviations: Decreased step length - right, Decreased step length - left, Decreased stride length, Decreased dorsiflexion - right, Decreased dorsiflexion - left, Decreased weight shift to right ?Gait velocity: decreased ?  ?  ?General Gait Details: Cuing to push RW slightly forward vs having it so close to body, cuing for increased weight bearing through RLE & to use BUE to offload RLE PRN. ? ? ?Stairs ?  ?  ?  ?  ?  ? ? ?Wheelchair Mobility ?  ? ?Modified Rankin (Stroke Patients Only) ?  ? ? ?  ?Balance Overall balance assessment: Needs assistance ?Sitting-balance support: Feet supported, Bilateral upper extremity supported ?Sitting balance-Leahy Scale: Fair ?  ?  ?Standing balance support: During functional activity ?Standing balance-Leahy Scale: Poor ?Standing balance comment:  stands to perform peri hygiene with CGA<>Min assist ?  ?  ?  ?  ?  ?  ?  ?  ?  ?  ?  ?  ? ?  ?Cognition Arousal/Alertness: Awake/alert ?Behavior During Therapy: [facility] for tasks assessed/performed ?Overall  Cognitive Status: Within Functional Limits for tasks assessed ?  ?  ?  ?  ?  ?  ?  ?  ?  ?  ?  ?  ?  ?  ?  ?  ?General Comments: Pleasant lady ?  ?  ? ?  ?Exercises General Exercises - Lower Extremity ?Long Arc Quad: AROM, Seated, Right, Strengthening, 10 reps ?Heel Slides: AAROM, Supine, Strengthening, Right, 10 reps ?Hip ABduction/ADduction: AAROM, Strengthening, 10 reps, Supine (hip abduction slides) ?Other Exercises ?Other Exercises: Marching in place while standing EOB with BUE support on RW with cuing for increasing weight bearing RLE & LLE foot clearance. ? ?  ?General Comments General comments (skin integrity, edema, etc.): Pt with continent void on BSC ?  ?  ? ?Pertinent Vitals/Pain Pain Assessment ?Pain Assessment: Faces ?Faces Pain Scale: Hurts even more ?Pain Location: RLE ?Pain Descriptors / Indicators: Discomfort, Grimacing, Guarding ?Pain Intervention(s): Monitored during session, Repositioned  ? ? ?Home Living   ?  ?  ?  ?  ?  ?  ?  ?  ?  ?   ?  ?Prior Function    ?  ?  ?   ? ?PT Goals (current goals can now be found in the care plan section) Acute Rehab PT Goals ?Patient Stated Goal: get better, return to PLOF ?PT Goal Formulation: With patient ?Time For Goal Achievement: 02/16/22 ?Potential to Achieve Goals: Good ?Progress towards PT goals: Progressing toward goals ? ?  ?Frequency ? ? ? BID ? ? ? ?  ?PT Plan Current plan remains appropriate  ? ? ?Co-evaluation   ?  ?  ?  ?  ? ?  ?AM-PAC PT "6 Clicks" Mobility   ?Outcome Measure ? Help needed turning from your back to your side while in a flat bed without using bedrails?: A Little ?Help needed moving from lying on your back to sitting on the side of a flat bed without using bedrails?: A Little ?Help needed moving to and from a bed to a chair (including a wheelchair)?: A Little ?Help needed standing up from a chair using your arms (e.g., wheelchair or bedside chair)?: A Little ?Help needed to walk in hospital room?: A Little ?Help needed climbing 3-5  steps with a railing? : A Lot ?6 Click Score: 17 ? ?  ?End of Session Equipment Utilized During Treatment: Gait belt ?Activity Tolerance: Patient tolerated treatment well ?Patient left: in chair;with chair alarm set;with call bell/phone within reach ?Nurse Communication: Mobility status ?PT Visit Diagnosis: Muscle weakness (generalized) (M62.81);Difficulty in walking, not elsewhere classified (R26.2);Pain;Unsteadiness on feet (R26.81) ?Pain - Right/Left: Right ?Pain - part of body: Hip ?  ? ? ?Time: 0881-1031 ?PT Time Calculation (min) (ACUTE ONLY): 27 min ? ?Charges:  $Therapeutic Activity: 23-37 mins          ?          ? ?Lavone Nian, PT, DPT ?02/03/22, 9:12 AM ? ? ? ?Waunita Schooner ?02/03/2022, 9:10 AM ? ?

## 2022-02-03 NOTE — TOC Progression Note (Signed)
Transition of Care (TOC) - Progression Note  ? ? ?Patient Details  ?Name: Tracy Harvey ?MRN: [id number] ?Date of Birth: 1946/08/05 ? ?Transition of Care (TOC) CM/SW Contact  ?Izola Price, RN ?Phone Number: ?02/03/2022, 5:10 PM ? ?Clinical Narrative: 3/19:  New SNF rec. Lives alone. New PASRR #[id number] A. Spoke with patient and first choice is [facility]. She declined the offered CMA lis, but approved a broad bed search (including permission to share pertinent information with facilities for placement) with [facility] as first choice. 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 Informed her weekday CM will follow up each step of the way. Bed search initiated. Simmie Davies RN CM  ?    ? ? ? ?  ?  ? ?Expected Discharge Plan and Services ?  ?  ?  ?  ?  ?                ?  ?  ?  ?  ?  ?  ?  ?  ?  ?  ? ? ?Social Determinants of Health (SDOH) Interventions ?  ? ?Readmission Risk Interventions ?No flowsheet data found. ? ?

## 2022-02-03 NOTE — Progress Notes (Signed)
?  Subjective: ?2 Days Post-Op Procedure(s) (LRB): ?INTRAMEDULLARY (IM) NAIL INTERTROCHANTRIC (Right) ?Patient reports pain as mild.   ?Patient is well, and has had no acute complaints or problems.  The patient is complaining of some nausea this morning. ?Plan is to go Rehab after hospital stay. ?Negative for chest pain and shortness of breath ?Fever: no ?Gastrointestinal: Complaining of mild nausea but no vomiting ? ?Objective: ?Vital signs in last 24 hours: ?Temp:  [98.3 ?F (36.8 ?C)-98.6 ?F (37 ?C)] 98.6 ?F (37 ?C) (03/19 9038) ?Pulse Rate:  [83-93] 87 (03/19 0337) ?Resp:  [16-20] 16 (03/19 0337) ?BP: (147-157)/(66-88) 147/88 (03/19 3338) ?SpO2:  [93 %-95 %] 93 % (03/19 0337) ? ?Intake/Output from previous day: ? ?Intake/Output Summary (Last 24 hours) at 02/03/2022 3291 ?Last data filed at 02/03/2022 0537 ?Gross per 24 hour  ?Intake 1088.32 ml  ?Output --  ?Net 1088.32 ml  ?  ?Intake/Output this shift: ?No intake/output data recorded. ? ?Labs: ?Recent Labs  ?  02/01/22 ?1440 02/02/22 ?0404 02/03/22 ?0406  ?HGB 13.0 10.6* 10.2*  ? ?Recent Labs  ?  02/02/22 ?0404 02/03/22 ?0406  ?WBC 11.8* 9.8  ?RBC 3.44* 3.35*  ?HCT 31.8* 30.5*  ?PLT 282 281  ? ?Recent Labs  ?  02/02/22 ?0404 02/03/22 ?0406  ?NA 127* 127*  ?K 3.4* 2.8*  ?CL 96* 96*  ?CO2 23 23  ?BUN 18 13  ?CREATININE 0.72 0.74  ?GLUCOSE 105* 94  ?CALCIUM 8.1* 8.6*  ? ?No results for input(s): LABPT, INR in the last 72 hours. ? ? ?EXAM ?General - Patient is Alert and Oriented ?Extremity - Neurovascular intact ?Sensation intact distally ?Dorsiflexion/Plantar flexion intact ?Compartment soft ?Dressing/Incision - clean, dry, scant drainage ?Motor Function - intact, moving foot and toes well on exam.  ? ?Past Medical History:  ?Diagnosis Date  ? Arthritis   ? Asthma   ? Basal cell carcinoma   ? face  ? Cancer [facility])   ? SKIN CANCER-BASAL CELL  ? Complication of anesthesia   ? Dysrhythmia   ? PACs, SVT  ? Foot fracture, left   ? WEARING BRACE  ? GERD (gastroesophageal  reflux disease)   ? Headache   ? H/O MIGRAINES  ? History of hiatal hernia   ? Hypertension   ? IBS (irritable bowel syndrome)   ? PONV (postoperative nausea and vomiting)   ? Shortness of breath dyspnea   ? Wheezing   ? OCC  ? ? ?Assessment/Plan: ?2 Days Post-Op Procedure(s) (LRB): ?INTRAMEDULLARY (IM) NAIL INTERTROCHANTRIC (Right) ?Principal Problem: ?  Nondisplaced subtrochanteric fracture of right femur, initial encounter for closed fracture ([place]) ?Active Problems: ?  Hyponatremia ?  GERD (gastroesophageal reflux disease) ?  Essential hypertension ?  Leukocytosis ?  Hypokalemia ? ?Estimated body mass index is 27.94 kg/m? as calculated from the following: ?  Height as of this encounter: 5' (1.524 m). ?  Weight as of this encounter: 64.9 kg. ?Advance diet ?Up with therapy ? ?Care management for discharge planning after physical therapy ? ?DVT Prophylaxis - Lovenox and compression devices ?Weight-Bearing as tolerated to right leg ? ?Reche Dixon, PA-C ?Orthopaedic Surgery ?02/03/2022, 7:12 AM ? ?

## 2022-02-03 NOTE — Plan of Care (Signed)
?  Problem: Elimination: ?Goal: Will not experience complications related to urinary retention ?Outcome: Progressing ?  ?Problem: Pain Managment: ?Goal: General experience of comfort will improve ?Outcome: Progressing ?  ?Problem: Skin Integrity: ?Goal: Risk for impaired skin integrity will decrease ?Outcome: Progressing ?  ?Problem: Nutrition: ?Goal: Adequate nutrition will be maintained ?Outcome: Progressing ?  ?Problem: Clinical Measurements: ?Goal: Will remain free from infection ?Outcome: Progressing ?  ?Problem: Clinical Measurements: ?Goal: Diagnostic test results will improve ?Outcome: Progressing ?  ?

## 2022-02-03 NOTE — Progress Notes (Signed)
Initial Nutrition Assessment ? ?DOCUMENTATION CODES:  ? ?Not applicable ? ?INTERVENTION:  ? ?Ensure Enlive po TID, each supplement provides 350 kcal and 20 grams of protein. ? ?Continue MVI with minerals daily. ? ?NUTRITION DIAGNOSIS:  ? ?Increased nutrient needs related to hip fracture, post-op healing as evidenced by estimated needs. ? ?GOAL:  ? ?Patient will meet greater than or equal to 90% of their needs ? ?MONITOR:  ? ?PO intake, Supplement acceptance, Labs ? ?REASON FOR ASSESSMENT:  ? ?Consult ?Hip fracture protocol, Assessment of nutrition requirement/status ? ?ASSESSMENT:  ? ?76 yo female admitted with R hip fracture. S/P reduction and internal fixation with IM nail 3/17. PMH includes arthritis, asthma, basal cell carcinoma, dysrhythmia, GERD, HTN, IBS. ? ?Currently on a heart healthy diet. Meal intakes not recorded. ?Unable to reach patient by phone this morning. ?She has increased nutrition needs to support healing post-op. ?No BM since 3/15; bowel regimen started 3/17.  ? ?Labs reviewed. Na 127, K 2.8 ? ?Medications reviewed and include vitamin C, Oscal with vitamin D, Colace, MVI with minerals, potassium chloride, Senokot, verapamil, magnesium sulfate. ? ?No recent weight history available for review.  ? ?NUTRITION - FOCUSED PHYSICAL EXAM: ? ?Unable to complete ? ?Diet Order:   ?Diet Order   ? ?       ?  Diet Heart Room service appropriate? Yes; Fluid consistency: Thin  Diet effective now       ?  ? ?  ?  ? ?  ? ? ?EDUCATION NEEDS:  ? ?No education needs have been identified at this time ? ?Skin:  Skin Assessment: Reviewed RN Assessment (surgical incisions R hip) ? ?Last BM:  3/15 ? ?Height:  ? ?Ht Readings from Last 1 Encounters:  ?02/01/22 5' (1.524 m)  ? ? ?Weight:  ? ?Wt Readings from Last 1 Encounters:  ?02/01/22 64.9 kg  ? ? ?BMI:  Body mass index is 27.94 kg/m?. ? ?Estimated Nutritional Needs:  ? ?Kcal:  1600-1800 ? ?Protein:  80-90 gm ? ?Fluid:  1.6-1.8 L ? ? ? ?Lucas Mallow RD, LDN, CNSC ?Please  refer to Amion for contact information.                                                       ? ?

## 2022-02-04 ENCOUNTER — Encounter: Payer: Self-pay | Admitting: Surgery

## 2022-02-04 LAB — BASIC METABOLIC PANEL
Anion gap: 6 (ref 5–15)
BUN: 15 mg/dL (ref 8–23)
CO2: 22 mmol/L (ref 22–32)
Calcium: 8 mg/dL — ABNORMAL LOW (ref 8.9–10.3)
Chloride: 102 mmol/L (ref 98–111)
Creatinine, Ser: 0.67 mg/dL (ref 0.44–1.00)
GFR, Estimated: >60 mL/min (ref >60)
Glucose, Bld: 104 mg/dL — ABNORMAL HIGH (ref 70–99)
Potassium: 4.5 mmol/L (ref 3.5–5.1)
Sodium: 130 mmol/L — ABNORMAL LOW (ref 135–145)

## 2022-02-04 LAB — CBC WITH DIFFERENTIAL/PLATELET
Abs Immature Granulocytes: 0.05 10*3/uL (ref 0.00–0.07)
Basophils Absolute: 0.1 10*3/uL (ref 0.0–0.1)
Basophils Relative: 1 %
Eosinophils Absolute: 0 10*3/uL (ref 0.0–0.5)
Eosinophils Relative: 0 %
HCT: 32.1 % — ABNORMAL LOW (ref 36.0–46.0)
Hemoglobin: 10.5 g/dL — ABNORMAL LOW (ref 12.0–15.0)
Immature Granulocytes: 1 %
Lymphocytes Relative: 14 %
Lymphs Abs: 1.3 10*3/uL (ref 0.7–4.0)
MCH: 31.3 pg (ref 26.0–34.0)
MCHC: 32.7 g/dL (ref 30.0–36.0)
MCV: 95.8 fL (ref 80.0–100.0)
Monocytes Absolute: 0.7 10*3/uL (ref 0.1–1.0)
Monocytes Relative: 7 %
Neutro Abs: 7.5 10*3/uL (ref 1.7–7.7)
Neutrophils Relative %: 77 %
Platelets: 294 10*3/uL (ref 150–400)
RBC: 3.35 MIL/uL — ABNORMAL LOW (ref 3.87–5.11)
RDW: 14 % (ref 11.5–15.5)
WBC: 9.6 10*3/uL (ref 4.0–10.5)
nRBC: 0 % (ref 0.0–0.2)

## 2022-02-04 LAB — RESP PANEL BY RT-PCR (FLU A&B, COVID) ARPGX2
Influenza A by PCR: NEGATIVE
Influenza B by PCR: NEGATIVE
SARS Coronavirus 2 by RT PCR: NEGATIVE

## 2022-02-04 LAB — MAGNESIUM: Magnesium: 2.4 mg/dL (ref 1.7–2.4)

## 2022-02-04 MED ORDER — ENSURE ENLIVE PO LIQD: 237.0000 mL | Freq: Three times a day (TID) | ORAL | 12 refills | Status: DC

## 2022-02-04 MED ORDER — AMLODIPINE BESYLATE 5 MG PO TABS: 5.0000 mg | ORAL_TABLET | Freq: Every day | ORAL | Status: DC

## 2022-02-04 MED ORDER — VERAPAMIL HCL 120 MG PO TABS: 60.0000 mg | ORAL_TABLET | Freq: Two times a day (BID) | ORAL | Status: AC

## 2022-02-04 MED ORDER — DOCUSATE SODIUM 100 MG PO CAPS: 100.0000 mg | ORAL_CAPSULE | Freq: Two times a day (BID) | ORAL | 0 refills | Status: DC

## 2022-02-04 NOTE — Progress Notes (Signed)
Physical Therapy Treatment ?Patient Details ?Name: Tracy Harvey ?MRN: [id number] ?DOB: [birth year] ?Today's Date: 02/04/2022 ? ? ?History of Present Illness Pt is 76 y/o F admitted on 02/01/22 after presenting to the ER from the outpatient ortho clinic for evaluation of R hip pain following a fall. Pt fell about 6 weeks ago & again the morning of admission. X-ray of the right hip shows subtle subacute nondisplaced fracture of the R subtrochanteric femur. Pt underwent surgery for R IM nail placement on 02/01/22. PMH: OA, HTN, GERD, asthma, hiatal hernia, IBS ? ?  ?PT Comments  ? ? Pt seen for PT tx with pt received in recliner & agreeable to session. Pt is able to completes sit>stand with RW & supervision with good awareness of safe hand placement. Pt ambulates increased distances (~50 ft) with RW & CGA with pt slowly increasing weight bearing through RLE. At end of session pt required min assist for sit>supine to elevate RLE onto bed. Pt is anticipating d/c to [facility] today but will continue to follow pt acutely to progress gait with LRAD, balance, strength, & endurance. ?   ?Recommendations for follow up therapy are one component of a multi-disciplinary discharge planning process, led by the attending physician.  Recommendations may be updated based on patient status, additional functional criteria and insurance authorization. ? ?Follow Up Recommendations ? Skilled nursing-short term rehab (<3 hours/day) ?  ?  ?Assistance Recommended at Discharge Frequent or constant Supervision/Assistance  ?Patient can return home with the following A little help with bathing/dressing/bathroom;Assistance with cooking/housework;Assist for transportation;Help with stairs or ramp for entrance;A little help with walking and/or transfers ?  ?Equipment Recommendations ? BSC/3in1  ?  ?Recommendations for Other Services   ? ? ?  ?Precautions / Restrictions Precautions ?Precautions: Fall ?Restrictions ?Weight Bearing Restrictions:  Yes ?RLE Weight Bearing: Weight bearing as tolerated  ?  ? ?Mobility ? Bed Mobility ?Overal bed mobility: Needs Assistance ?Bed Mobility: Sit to Supine ?  ?  ?Supine to sit: Supervision, HOB elevated ?Sit to supine: Min assist (to elevate RLE onto bed) ?  ?General bed mobility comments: Improving balance & ability to upright trunk to sitting EOB with HOB elevated & use of bed rails. ?  ? ?Transfers ?Overall transfer level: Needs assistance ?Equipment used: Rolling walker (2 wheels) ?Transfers: Sit to/from Stand ?Sit to Stand: Supervision ?Stand pivot transfers: Min guard ?  ?  ?  ?  ?  ?  ? ?Ambulation/Gait ?Ambulation/Gait assistance: Min guard ?Gait Distance (Feet): 50 Feet ?Assistive device: Rolling walker (2 wheels) ?Gait Pattern/deviations: Decreased step length - right, Decreased step length - left, Decreased stride length, Decreased dorsiflexion - right, Decreased dorsiflexion - left, Decreased weight shift to right ?Gait velocity: decreased ?  ?  ?General Gait Details: Decreased heel strike BLE ? ? ?Stairs ?  ?  ?  ?  ?  ? ? ?Wheelchair Mobility ?  ? ?Modified Rankin (Stroke Patients Only) ?  ? ? ?  ?Balance Overall balance assessment: Needs assistance ?Sitting-balance support: Feet supported, Bilateral upper extremity supported ?Sitting balance-Leahy Scale: Good ?Sitting balance - Comments: supervision static sitting EOB ?  ?Standing balance support: During functional activity, Bilateral upper extremity supported ?Standing balance-Leahy Scale: Fair ?Standing balance comment: Pt able to perform peri hygiene in standing with CGA. ?  ?  ?  ?  ?  ?  ?  ?  ?  ?  ?  ?  ? ?  ?Cognition Arousal/Alertness: Awake/alert ?Behavior During Therapy: [facility] for tasks  assessed/performed ?Overall Cognitive Status: Within Functional Limits for tasks assessed ?  ?  ?  ?  ?  ?  ?  ?  ?  ?  ?  ?  ?  ?  ?  ?  ?General Comments: Pleasant lady, pt reports feeling scared with mobility as she's fearful of falling again ?  ?  ? ?   ?Exercises General Exercises - Lower Extremity ?Long Arc Quad: AROM, Seated, Right, Strengthening, 10 reps ? ?  ?General Comments General comments (skin integrity, edema, etc.): Pt stands at sink to perform grooming tasks with CGA & has continent void on BSC. ?  ?  ? ?Pertinent Vitals/Pain Pain Assessment ?Pain Assessment: Faces ?Faces Pain Scale: Hurts a little bit ?Pain Location: RLE ?Pain Descriptors / Indicators: Discomfort, Grimacing, Guarding ?Pain Intervention(s): Monitored during session, Repositioned, Limited activity within patient's tolerance  ? ? ?Home Living   ?  ?  ?  ?  ?  ?  ?  ?  ?  ?   ?  ?Prior Function    ?  ?  ?   ? ?PT Goals (current goals can now be found in the care plan section) Acute Rehab PT Goals ?Patient Stated Goal: get better, return to PLOF ?PT Goal Formulation: With patient ?Time For Goal Achievement: 02/16/22 ?Potential to Achieve Goals: Good ?Progress towards PT goals: Progressing toward goals ? ?  ?Frequency ? ? ? BID ? ? ? ?  ?PT Plan Current plan remains appropriate  ? ? ?Co-evaluation   ?  ?  ?  ?  ? ?  ?AM-PAC PT "6 Clicks" Mobility   ?Outcome Measure ? Help needed turning from your back to your side while in a flat bed without using bedrails?: None ?Help needed moving from lying on your back to sitting on the side of a flat bed without using bedrails?: A Little ?Help needed moving to and from a bed to a chair (including a wheelchair)?: A Little ?Help needed standing up from a chair using your arms (e.g., wheelchair or bedside chair)?: A Little ?Help needed to walk in hospital room?: A Little ?Help needed climbing 3-5 steps with a railing? : A Lot ?6 Click Score: 18 ? ?  ?End of Session Equipment Utilized During Treatment: Gait belt ?Activity Tolerance: Patient tolerated treatment well ?Patient left: in bed;with call bell/phone within reach;with bed alarm set ?Nurse Communication: Mobility status ?PT Visit Diagnosis: Muscle weakness (generalized) (M62.81);Difficulty in  walking, not elsewhere classified (R26.2);Pain;Unsteadiness on feet (R26.81) ?Pain - Right/Left: Right ?Pain - part of body: Hip ?  ? ? ?Time: 1700-1749 ?PT Time Calculation (min) (ACUTE ONLY): 11 min ? ?Charges:  $Therapeutic Activity: 8-22 mins          ?          ? ?Lavone Nian, PT, DPT ?02/04/22, 1:55 PM ? ? ? ?Waunita Schooner ?02/04/2022, 1:54 PM ? ?

## 2022-02-04 NOTE — Evaluation (Signed)
Occupational Therapy Evaluation ?Patient Details ?Name: Tracy Harvey ?MRN: [id number] ?DOB: Apr 28, 1946 ?Today's Date: 02/04/2022 ? ? ?History of Present Illness Pt is 76 y/o F admitted on 02/01/22 after presenting to the ER from the outpatient ortho clinic for evaluation of R hip pain following a fall. Pt fell about 6 weeks ago & again the morning of admission. X-ray of the right hip shows subtle subacute nondisplaced fracture of the R subtrochanteric femur. Pt underwent surgery for R IM nail placement on 02/01/22. PMH: OA, HTN, GERD, asthma, hiatal hernia, IBS  ? ?Clinical Impression ?  ?Patient presenting with decreased Ind in self care,balance, functional mobility/transfers, endurance, and safety awareness. Patient reports living at home alone without use of AD at baseline. Pt needing min guard for functional mobility with use of RW and self care tasks. Pt does fatigue quickly but is very motivated to return home. She was able to ambulate to bathroom and able to void but unable to have BM this session. Pt returns to recliner chair at end of session with all needs within reach. Patient will benefit from acute OT to increase overall independence in the areas of ADLs, functional mobility, and safety awareness in order to safely discharge to next venue of care.  ?   ? ?Recommendations for follow up therapy are one component of a multi-disciplinary discharge planning process, led by the attending physician.  Recommendations may be updated based on patient status, additional functional criteria and insurance authorization.  ? ?Follow Up Recommendations ? Skilled nursing-short term rehab (<3 hours/day)  ?  ?Assistance Recommended at Discharge Intermittent Supervision/Assistance  ?Patient can return home with the following A little help with walking and/or transfers;A little help with bathing/dressing/bathroom;Assistance with cooking/housework;Assist for transportation;Help with stairs or ramp for entrance ? ?  ?Functional  Status Assessment ? Patient has had a recent decline in their functional status and demonstrates the ability to make significant improvements in function in a reasonable and predictable amount of time.  ?Equipment Recommendations ? Other (comment) (defer to next venue of care)  ?  ?   ?Precautions / Restrictions Precautions ?Precautions: Fall ?Restrictions ?Weight Bearing Restrictions: Yes ?RLE Weight Bearing: Weight bearing as tolerated  ? ?  ? ?Mobility Bed Mobility ?  ?  ?  ?  ?  ?  ?  ?General bed mobility comments: seated in recliner chair ?  ? ?Transfers ?Overall transfer level: Needs assistance ?Equipment used: Rolling walker (2 wheels) ?Transfers: Sit to/from Stand ?Sit to Stand: Min guard ?  ?  ?  ?  ?  ?  ?  ? ?  ?Balance Overall balance assessment: Needs assistance ?Sitting-balance support: Feet supported, Bilateral upper extremity supported ?Sitting balance-Leahy Scale: Good ?  ?  ?Standing balance support: Single extremity supported, During functional activity ?Standing balance-Leahy Scale: Fair ?Standing balance comment: Pt able to perform peri hygiene in standing with CGA. ?  ?  ?  ?  ?  ?  ?  ?  ?  ?  ?  ?   ? ?ADL either performed or assessed with clinical judgement  ? ?ADL Overall ADL's : Needs assistance/impaired ?  ?  ?Grooming: Wash/dry hands;Wash/dry face;Standing;Min guard ?  ?  ?  ?  ?  ?  ?  ?  ?  ?Toilet Transfer: Rolling walker (2 wheels);Min guard;Comfort height toilet ?  ?Toileting- Water quality scientist and Hygiene: Min guard;Sit to/from stand ?  ?  ?  ?Functional mobility during ADLs: Min guard;Rolling walker (2 wheels) ?   ? ? ? ?  Vision Baseline Vision/History: 1 Wears glasses ?Ability to See in Adequate Light: 0 Adequate ?Patient Visual Report: No change from baseline ?   ?   ? ?Extremity/Trunk Assessment Upper Extremity Assessment ?Upper Extremity Assessment: Overall WFL for tasks assessed ?  ?Lower Extremity Assessment ?Lower Extremity Assessment: Defer to PT evaluation ?  ?  ?  ?    ?Cognition Arousal/Alertness: Awake/alert ?Behavior During Therapy: [facility] for tasks assessed/performed ?  ?  ?  ?  ?  ?  ?  ?  ?  ?  ?  ?  ?  ?  ?  ?  ?  ?General Comments: Pt is very pleasant and cooperative throughout session. A & O x4. ?  ?  ?General Comments  Pt stands at sink to perform grooming tasks with CGA & has continent void on BSC. ? ?  ? ? ?  ?  ?OT Problem List: Decreased strength;Decreased knowledge of use of DME or AE;Decreased activity tolerance;Impaired balance (sitting and/or standing);Decreased safety awareness;Pain ?  ?   ?OT Treatment/Interventions: Self-care/ADL training;Manual therapy;Therapeutic exercise;Modalities;Patient/family education;Balance training;Energy conservation;Therapeutic activities;DME and/or AE instruction  ?  ?OT Goals(Current goals can be found in the care plan section) Acute Rehab OT Goals ?Patient Stated Goal: to get stronger and go home ?OT Goal Formulation: With patient ?Time For Goal Achievement: 02/18/22 ?Potential to Achieve Goals: Fair ?ADL Goals ?Pt Will Perform Grooming: with modified independence;standing ?Pt Will Perform Lower Body Dressing: with modified independence;sit to/from stand ?Pt Will Transfer to Toilet: with modified independence;ambulating ?Pt Will Perform Toileting - Clothing Manipulation and hygiene: with modified independence;sit to/from stand  ?OT Frequency: Min 2X/week ?  ? ?   ?AM-PAC OT "6 Clicks" Daily Activity     ?Outcome Measure Help from another person eating meals?: None ?Help from another person taking care of personal grooming?: A Little ?Help from another person toileting, which includes using toliet, bedpan, or urinal?: A Little ?Help from another person bathing (including washing, rinsing, drying)?: A Little ?Help from another person to put on and taking off regular upper body clothing?: None ?Help from another person to put on and taking off regular lower body clothing?: A Little ?6 Click Score: 20 ?  ?End of Session Equipment  Utilized During Treatment: Rolling walker (2 wheels) ?Nurse Communication: Mobility status ? ?Activity Tolerance: Patient tolerated treatment well ?Patient left: in chair;with call bell/phone within reach;with chair alarm set ? ?OT Visit Diagnosis: Repeated falls (R29.6);Unsteadiness on feet (R26.81);Muscle weakness (generalized) (M62.81)  ?              ?Time: 3212-2482 ?OT Time Calculation (min): 25 min ?Charges:  OT General Charges ?$OT Visit: 1 Visit ?OT Evaluation ?$OT Eval Moderate Complexity: 1 Mod ?OT Treatments ?$Self Care/Home Management : 8-22 mins ? ?Darleen Crocker, [place], OTR/L , CBIS ?ascom [phone]  ?02/04/22, 12:54 PM  ?

## 2022-02-04 NOTE — Discharge Summary (Signed)
?Physician Discharge Summary ?  ?Patient: Tracy Harvey MRN: [id number] DOB: Apr 03, 1946  ?Admit date:     02/01/2022  ?Discharge date: 02/04/22  ?Discharge Physician: Dwyane Dee  ? ?PCP: Rusty Aus, MD  ? ?Recommendations at discharge:  ? ?Follow-up with orthopedic surgery ?If blood pressure becomes elevated, resume losartan ?Repeat BMP in 2 to 3 days to ensure sodium stability ? ?Discharge Diagnoses: ?Principal Problem: ?  Nondisplaced subtrochanteric fracture of right femur, initial encounter for closed fracture ([place]) ?Active Problems: ?  Hyponatremia ?  GERD (gastroesophageal reflux disease) ?  Essential hypertension ?  Hypokalemia ? ?Resolved Problems: ?  Leukocytosis ? ?Hospital Course: ?Tracy Harvey is a 76 year old female with PMH asthma, GERD, HTN, IBS, osteoarthritis who presented to the hospital from outpatient orthopedic clinic after a mechanical fall from tripping on her rolling walker.  She had fallen approximately 6 weeks ago on 01/20/2022 and had persistent right-sided hip pain. ?Imaging on admission showed subtle subacute nondisplaced fracture of the right subtrochanteric femur.  Orthopedic surgery was consulted and she was taken to the OR for surgical repair on 02/01/2022. ?See below for further problem-based plan. ? ?Assessment and Plan: ?* Nondisplaced subtrochanteric fracture of right femur, initial encounter for closed fracture ([place]) ?- s/p mechanical fall with a nondisplaced subtrochanteric fracture of right femur ?- s/p reduction and internal fixation with orthopedic surgery on 02/01/2022 ?- Continue working with PT ?- Continue pain control ?- Weightbearing as tolerated to right lower extremity ?-Been accepted at [facility] ? ?Hyponatremia ?- suspected due to HCTZ and possibly some volume depletion  ?- Hold hydrochlorothiazide; will not resume at discharge ?- Continue encouraging good oral intake ? ?Hypokalemia ?- Replete as needed ? ?Essential hypertension ?- Continue amlodipine and  verapamil ?-If blood pressure becomes elevated, resume losartan ? ?GERD (gastroesophageal reflux disease) ?- Continue pantoprazole ? ?Leukocytosis-resolved as of 02/04/2022 ?- Low suspicion for underlying infection ?- Likely due to reactive process ? ? ? ? ?  ? ?Consultants: Orthopedic surgery ?Procedures performed: Reduction and internal fixation of nondisplaced subtrochanteric right hip fracture, 02/01/2022 ?Disposition: Skilled nursing facility ?Diet recommendation:  ?Discharge Diet Orders (From admission, onward)  ? ?  Start     Ordered  ? 02/04/22 0000  Diet - low sodium heart healthy       ? 02/04/22 1204  ? ?  ?  ? ?  ? ?Cardiac diet ?DISCHARGE MEDICATION: ?Allergies as of 02/04/2022   ? ?   Reactions  ? Augmentin [amoxicillin-pot Clavulanate] Nausea And Vomiting  ? Did it involve swelling of the face/tongue/throat, SOB, or low BP? No ?Did it involve sudden or severe rash/hives, skin peeling, or any reaction on the inside of your mouth or nose? No ?Did you need to seek medical attention at a hospital or doctor's office? No ?When did it last happen? More than 20 years ago     ?If all above answers are ?NO?, may proceed with cephalosporin use.  ? Chocolate Flavor Other (See Comments)  ? Headaches.  ? Egg [eggs Or Egg-derived Products] Nausea And Vomiting, Other (See Comments)  ? Severe migraines.  ? Eryc [erythromycin] Other (See Comments)  ? Unknown reaction  ? Morphine And Related Nausea And Vomiting  ? Vomiting for days.  ? Tilactase Other (See Comments)  ? Unknown reaction.  ? ?  ? ?  ?Medication List  ?  ? ?STOP taking these medications   ? ?alendronate 70 MG tablet ?Commonly known as: FOSAMAX ?  ?diltiazem 180 MG 24  hr capsule ?Commonly known as: TIAZAC ?  ?loperamide 2 MG capsule ?Commonly known as: IMODIUM ?  ?losartan-hydrochlorothiazide 100-12.5 MG tablet ?Commonly known as: HYZAAR ?  ?nystatin 100000 UNIT/ML suspension ?Commonly known as: MYCOSTATIN ?  ?ondansetron 4 MG tablet ?Commonly known as:  Zofran ?  ?potassium chloride 10 MEQ tablet ?Commonly known as: KLOR-CON M ?  ?traMADol 50 MG tablet ?Commonly known as: ULTRAM ?  ?triamterene-hydrochlorothiazide 37.5-25 MG capsule ?Commonly known as: DYAZIDE ?  ? ?  ? ?TAKE these medications   ? ?acetaminophen 500 MG tablet ?Commonly known as: TYLENOL ?Take 1,000 mg by mouth every 6 (six) hours as needed (for pain.). ?  ?albuterol 108 (90 Base) MCG/ACT inhaler ?Commonly known as: VENTOLIN HFA ?Inhale 2 puffs into the lungs every 6 (six) hours as needed for wheezing or shortness of breath. ?  ?amLODipine 5 MG tablet ?Commonly known as: NORVASC ?Take 1 tablet (5 mg total) by mouth daily. ?Start taking on: February 05, 2022 ?What changed:  ?how much to take ?when to take this ?  ?aspirin 81 MG chewable tablet ?Chew 81 mg by mouth daily. ?  ?azelastine 0.1 % nasal spray ?Commonly known as: ASTELIN ?Place into both nostrils daily as needed for rhinitis. Use in each nostril as directed ?  ?BIOTIN PO ?Take by mouth. ?  ?budesonide-formoterol 160-4.5 MCG/ACT inhaler ?Commonly known as: SYMBICORT ?Inhale 2 puffs into the lungs 2 (two) times daily. ?  ?CALCIUM 600-D PO ?Take 1 tablet by mouth 3 (three) times daily. ?  ?docusate sodium 100 MG capsule ?Commonly known as: COLACE ?Take 1 capsule (100 mg total) by mouth 2 (two) times daily. ?  ?enoxaparin 40 MG/0.4ML injection ?Commonly known as: LOVENOX ?Inject 0.4 mLs (40 mg total) into the skin daily for 14 days. ?  ?estradiol 1 MG tablet ?Commonly known as: ESTRACE ?Take 1 mg by mouth daily after breakfast. ?  ?feeding supplement Liqd ?Take 237 mLs by mouth 3 (three) times daily between meals. ?  ?ferrous gluconate 240 (27 FE) MG tablet ?Commonly known as: FERGON ?Take 240 mg by mouth once a week. ?  ?fexofenadine 180 MG tablet ?Commonly known as: ALLEGRA ?Take 90 mg by mouth daily. ?  ?fluticasone 50 MCG/ACT nasal spray ?Commonly known as: FLONASE ?Place 2 sprays into both nostrils daily after breakfast. ?   ?HYDROcodone-acetaminophen 5-325 MG tablet ?Commonly known as: NORCO/VICODIN ?Take 1-2 tablets by mouth every 6 (six) hours as needed for moderate pain. ?  ?Magnesium 500 MG Tabs ?Take 250 mg by mouth daily after breakfast. Hold for diarrhea ?  ?montelukast 10 MG tablet ?Commonly known as: SINGULAIR ?Take 10 mg by mouth at bedtime. ?  ?multivitamin tablet ?Take 1 tablet by mouth daily. ?  ?omeprazole 20 MG capsule ?Commonly known as: PRILOSEC ?Take 20 mg by mouth daily after breakfast. ?  ?PROBIOTIC PO ?Take 1 tablet by mouth daily. ?  ?Prolia 60 MG/ML Sosy injection ?Generic drug: denosumab ?Inject into the skin. ?  ?verapamil 120 MG tablet ?Commonly known as: CALAN ?Take 0.5 tablets (60 mg total) by mouth 2 (two) times daily. ?What changed: additional instructions ?  ?vitamin C 100 MG tablet ?Take 500 mg by mouth daily. ?  ?Vitamin D3 50 MCG (2000 UT) Tabs ?Take 2,000 Units by mouth daily after breakfast. ?  ? ?  ? ?  ?  ? ? ?  ?Discharge Care Instructions  ?(From admission, onward)  ?  ? ? ?  ? ?  Start     Ordered  ?  02/04/22 0000  Discharge wound care:       ?Comments: Continue surgical dressing until follow-up  ? 02/04/22 1204  ? ?  ?  ? ?  ? ? Contact information for follow-up providers   ? ? Lattie Corns, PA-C Follow up in 2 week(s).   ?Specialty: Physician Assistant ?Why: For staple removal ?Contact information: ?[place] ?[facility] ?[place] [address] ?[phone] ? ? ?  ?  ? ?  ?  ? ? Contact information for after-discharge care   ? ? Destination   ? ? [facility] Preferred SNF .   ?Service: Skilled Nursing ?Contact information: ?[address] ?[place] ?[phone] ? ?  ?  ? ?  ?  ? ?  ?  ? ?  ? ?Discharge Exam: ?Filed Weights  ? 02/01/22 1407 02/01/22 1651  ?Weight: 64.9 kg 64.9 kg  ? ?Physical Exam ?Constitutional:   ?   General: She is not in acute distress. ?    Appearance: Normal appearance.  ?HENT:  ?   Head: Normocephalic and atraumatic.  ?   Mouth/Throat:  ?   Mouth: Mucous membranes are moist.  ?Eyes:  ?   Extraocular Movements: Extraocular movements intact.  ?Cardiovascular:  ?   Rat

## 2022-02-04 NOTE — Care Management Important Message (Signed)
Important Message ? ?Patient Details  ?Name: Tracy Harvey ?MRN: [id number] ?Date of Birth: 08-Feb-1946 ? ? ?Medicare Important Message Given:  N/A - LOS <3 / Initial given by admissions ? ? ? ? ?[name] ?02/04/2022, 9:34 AM ?

## 2022-02-04 NOTE — Progress Notes (Signed)
?  Subjective: ?3 Days Post-Op Procedure(s) (LRB): ?INTRAMEDULLARY (IM) NAIL INTERTROCHANTRIC (Right) ?Patient reports pain as mild.   ?Patient is well, and has had no acute complaints or problems.  The patient is complaining of some nausea this morning. ?Plan is to go Rehab after hospital stay. ?Negative for chest pain and shortness of breath ?Fever: no ?Gastrointestinal: Complaining of mild nausea but no vomiting ? ?Objective: ?Vital signs in last 24 hours: ?Temp:  [98.1 ?F (36.7 ?C)-98.5 ?F (36.9 ?C)] 98.2 ?F (36.8 ?C) (03/20 0406) ?Pulse Rate:  [93-101] 101 (03/20 0406) ?Resp:  [16-18] 16 (03/20 0406) ?BP: (111-141)/(72-91) 140/72 (03/20 0406) ?SpO2:  [94 %-97 %] 97 % (03/20 0406) ? ?Intake/Output from previous day: ? ?Intake/Output Summary (Last 24 hours) at 02/04/2022 0700 ?Last data filed at 02/03/2022 2115 ?Gross per 24 hour  ?Intake 720 ml  ?Output 0 ml  ?Net 720 ml  ?  ?Intake/Output this shift: ?No intake/output data recorded. ? ?Labs: ?Recent Labs  ?  02/01/22 ?1440 02/02/22 ?0404 02/03/22 ?0406 02/04/22 ?0356  ?HGB 13.0 10.6* 10.2* 10.5*  ? ?Recent Labs  ?  02/03/22 ?0406 02/04/22 ?0356  ?WBC 9.8 9.6  ?RBC 3.35* 3.35*  ?HCT 30.5* 32.1*  ?PLT 281 294  ? ?Recent Labs  ?  02/03/22 ?1631 02/04/22 ?0356  ?NA 127* 130*  ?K 3.5 4.5  ?CL 97* 102  ?CO2 25 22  ?BUN 14 15  ?CREATININE 0.89 0.67  ?GLUCOSE 125* 104*  ?CALCIUM 8.2* 8.0*  ? ?No results for input(s): LABPT, INR in the last 72 hours. ? ? ?EXAM ?General - Patient is Alert and Oriented ?Extremity - Neurovascular intact ?Sensation intact distally ?Dorsiflexion/Plantar flexion intact ?Compartment soft ?Dressing/Incision - clean, dry, scant drainage ?Motor Function - intact, moving foot and toes well on exam.  The patient ambulated 15 feet with physical therapy. ? ?Past Medical History:  ?Diagnosis Date  ? Arthritis   ? Asthma   ? Basal cell carcinoma   ? face  ? Cancer [facility])   ? SKIN CANCER-BASAL CELL  ? Complication of anesthesia   ? Dysrhythmia   ? PACs, SVT   ? Foot fracture, left   ? WEARING BRACE  ? GERD (gastroesophageal reflux disease)   ? Headache   ? H/O MIGRAINES  ? History of hiatal hernia   ? Hypertension   ? IBS (irritable bowel syndrome)   ? PONV (postoperative nausea and vomiting)   ? Shortness of breath dyspnea   ? Wheezing   ? OCC  ? ? ?Assessment/Plan: ?3 Days Post-Op Procedure(s) (LRB): ?INTRAMEDULLARY (IM) NAIL INTERTROCHANTRIC (Right) ?Principal Problem: ?  Nondisplaced subtrochanteric fracture of right femur, initial encounter for closed fracture ([place]) ?Active Problems: ?  Hyponatremia ?  GERD (gastroesophageal reflux disease) ?  Essential hypertension ?  Leukocytosis ?  Hypokalemia ? ?Estimated body mass index is 27.94 kg/m? as calculated from the following: ?  Height as of this encounter: 5' (1.524 m). ?  Weight as of this encounter: 64.9 kg. ?Advance diet ?Up with therapy ? ?Care management for discharge planning after physical therapy.  Possible today. ? ?DVT Prophylaxis - Lovenox and compression devices ?Weight-Bearing as tolerated to right leg ? ?Reche Dixon, PA-C ?Orthopaedic Surgery ?02/04/2022, 7:00 AM ? ?

## 2022-02-04 NOTE — Plan of Care (Signed)
?  Problem: Education: ?Goal: Knowledge of General Education information will improve ?Description: Including pain rating scale, medication(s)/side effects and non-pharmacologic comfort measures ?Outcome: Progressing ?  ?Problem: Health Behavior/Discharge Planning: ?Goal: Ability to manage health-related needs will improve ?Outcome: Progressing ?  ?Problem: Clinical Measurements: ?Goal: Ability to maintain clinical measurements within normal limits will improve ?Outcome: Progressing ?  ?Problem: Clinical Measurements: ?Goal: Diagnostic test results will improve ?Outcome: Progressing ?  ?Problem: Clinical Measurements: ?Goal: Respiratory complications will improve ?Outcome: Progressing ?  ?Problem: Nutrition: ?Goal: Adequate nutrition will be maintained ?Outcome: Progressing ?  ?Problem: Coping: ?Goal: Level of anxiety will decrease ?Outcome: Progressing ?  ?

## 2022-02-04 NOTE — TOC Progression Note (Signed)
Transition of Care (TOC) - Progression Note  ? ? ?Patient Details  ?Name: Tracy Harvey ?MRN: [id number] ?Date of Birth: 1946/04/04 ? ?Transition of Care (TOC) CM/SW Contact  ?Conception Oms, RN ?Phone Number: ?02/04/2022, 12:34 PM ? ?Clinical Narrative:    ?Called [facility] to transport the patient to [facility], the patient is aware and will notify her family ? ? ?  ?  ? ?Expected Discharge Plan and Services ?  ?  ?  ?  ?  ?Expected Discharge Date: 02/04/22               ?  ?  ?  ?  ?  ?  ?  ?  ?  ?  ? ? ?Social Determinants of Health (SDOH) Interventions ?  ? ?Readmission Risk Interventions ?No flowsheet data found. ? ?

## 2022-02-04 NOTE — Progress Notes (Signed)
Physical Therapy Treatment ?Patient Details ?Name: Tracy Harvey ?MRN: [id number] ?DOB: Feb 19, 1946 ?Today's Date: 02/04/2022 ? ? ?History of Present Illness Pt is 76 y/o F admitted on 02/01/22 after presenting to the ER from the outpatient ortho clinic for evaluation of R hip pain following a fall. Pt fell about 6 weeks ago & again the morning of admission. X-ray of the right hip shows subtle subacute nondisplaced fracture of the R subtrochanteric femur. Pt underwent surgery for R IM nail placement on 02/01/22. PMH: OA, HTN, GERD, asthma, hiatal hernia, IBS ? ?  ?PT Comments  ? ? Pt seen for PT evaluation with pt demonstrating improvement in functional mobility as pt is able to complete supine>sit with supervision but still with heavy reliance on hospital bed features. Pt is able to complete transfers with CGA<>min assist with improving recall of hand placement. Pt ambulates in room with RW & CGA with improvement in mechanics when provided with lower height RW. Pt does demonstrate good safety awareness as she speaks of inability to d/c home alone as she would be unable to prepare meals & care for herself. Continue to recommend STR upon d/c to maximize independence with functional mobility & reduce fall risk prior to return home. ?   ?Recommendations for follow up therapy are one component of a multi-disciplinary discharge planning process, led by the attending physician.  Recommendations may be updated based on patient status, additional functional criteria and insurance authorization. ? ?Follow Up Recommendations ? Skilled nursing-short term rehab (<3 hours/day) ?  ?  ?Assistance Recommended at Discharge Frequent or constant Supervision/Assistance  ?Patient can return home with the following A little help with bathing/dressing/bathroom;Assistance with cooking/housework;Assist for transportation;Help with stairs or ramp for entrance;A little help with walking and/or transfers ?  ?Equipment Recommendations ? BSC/3in1  ?   ?Recommendations for Other Services   ? ? ?  ?Precautions / Restrictions Precautions ?Precautions: Fall ?Restrictions ?Weight Bearing Restrictions: Yes ?RLE Weight Bearing: Weight bearing as tolerated  ?  ? ?Mobility ? Bed Mobility ?Overal bed mobility: Needs Assistance ?Bed Mobility: Supine to Sit ?  ?  ?Supine to sit: Supervision, HOB elevated ?  ?  ?General bed mobility comments: Improving balance & ability to upright trunk to sitting EOB with HOB elevated & use of bed rails. ?  ? ?Transfers ?Overall transfer level: Needs assistance ?Equipment used: Rolling walker (2 wheels) ?Transfers: Sit to/from Stand, Bed to chair/wheelchair/BSC ?Sit to Stand: Min guard, Supervision (pt able to recall correct hand placement during sit>stand (still requires them for stand>sit) without cuing during session) ?Stand pivot transfers: Min guard ?  ?  ?  ?  ?  ?  ? ?Ambulation/Gait ?Ambulation/Gait assistance: Min guard ?Gait Distance (Feet):  (20 + 11 ft) ?Assistive device: Rolling walker (2 wheels) ?Gait Pattern/deviations: Decreased step length - right, Decreased step length - left, Decreased stride length, Decreased dorsiflexion - right, Decreased dorsiflexion - left, Decreased weight shift to right ?Gait velocity: decreased ?  ?  ?General Gait Details: Decreased heel strike BLE, cuing for RW management. Provided pt with more appropriate height walker & pt with improved body mechanics & ability to rely on RW PRN. ? ? ?Stairs ?  ?  ?  ?  ?  ? ? ?Wheelchair Mobility ?  ? ?Modified Rankin (Stroke Patients Only) ?  ? ? ?  ?Balance Overall balance assessment: Needs assistance ?Sitting-balance support: Feet supported, Bilateral upper extremity supported ?Sitting balance-Leahy Scale: Fair ?Sitting balance - Comments: supervision static sitting EOB ?  ?  Standing balance support: Single extremity supported, During functional activity ?Standing balance-Leahy Scale: Fair ?Standing balance comment: Pt able to perform peri hygiene in  standing with CGA. ?  ?  ?  ?  ?  ?  ?  ?  ?  ?  ?  ?  ? ?  ?Cognition Arousal/Alertness: Awake/alert ?Behavior During Therapy: [facility] for tasks assessed/performed ?Overall Cognitive Status: Within Functional Limits for tasks assessed ?  ?  ?  ?  ?  ?  ?  ?  ?  ?  ?  ?  ?  ?  ?  ?  ?General Comments: Pleasant lady, pt reports feeling scared with mobility as she's fearful of falling again ?  ?  ? ?  ?Exercises General Exercises - Lower Extremity ?Long Arc Quad: AROM, Seated, Right, Strengthening, 10 reps ? ?  ?General Comments General comments (skin integrity, edema, etc.): Pt stands at sink to perform grooming tasks with CGA & has continent void on BSC. ?  ?  ? ?Pertinent Vitals/Pain Pain Assessment ?Pain Assessment: Faces ?Faces Pain Scale: Hurts little more ?Pain Location: RLE ?Pain Descriptors / Indicators: Discomfort, Grimacing, Guarding ?Pain Intervention(s): Premedicated before session, Monitored during session, Repositioned, Limited activity within patient's tolerance  ? ? ?Home Living   ?  ?  ?  ?  ?  ?  ?  ?  ?  ?   ?  ?Prior Function    ?  ?  ?   ? ?PT Goals (current goals can now be found in the care plan section) Acute Rehab PT Goals ?Patient Stated Goal: get better, return to PLOF ?PT Goal Formulation: With patient ?Time For Goal Achievement: 02/16/22 ?Potential to Achieve Goals: Good ?Progress towards PT goals: Progressing toward goals ? ?  ?Frequency ? ? ? BID ? ? ? ?  ?PT Plan Current plan remains appropriate  ? ? ?Co-evaluation   ?  ?  ?  ?  ? ?  ?AM-PAC PT "6 Clicks" Mobility   ?Outcome Measure ? Help needed turning from your back to your side while in a flat bed without using bedrails?: None ?Help needed moving from lying on your back to sitting on the side of a flat bed without using bedrails?: A Little ?Help needed moving to and from a bed to a chair (including a wheelchair)?: A Little ?Help needed standing up from a chair using your arms (e.g., wheelchair or bedside chair)?: A Little ?Help needed  to walk in hospital room?: A Little ?Help needed climbing 3-5 steps with a railing? : A Lot ?6 Click Score: 18 ? ?  ?End of Session Equipment Utilized During Treatment: Gait belt ?Activity Tolerance: Patient tolerated treatment well ?Patient left: in chair;with chair alarm set;with call bell/phone within reach ?Nurse Communication: Mobility status ?PT Visit Diagnosis: Muscle weakness (generalized) (M62.81);Difficulty in walking, not elsewhere classified (R26.2);Pain;Unsteadiness on feet (R26.81) ?Pain - Right/Left: Right ?Pain - part of body: Hip ?  ? ? ?Time: [phone] ?PT Time Calculation (min) (ACUTE ONLY): 23 min ? ?Charges:  $Therapeutic Activity: 23-37 mins          ?          ? ?Lavone Nian, PT, DPT ?02/04/22, 9:58 AM ? ? ? ?Waunita Schooner ?02/04/2022, 9:57 AM ? ?

## 2022-02-04 NOTE — TOC Progression Note (Addendum)
Transition of Care (TOC) - Progression Note  ? ? ?Patient Details  ?Name: GLEN BLATCHLEY ?MRN: [id number] ?Date of Birth: 1946-01-25 ? ?Transition of Care (TOC) CM/SW Contact  ?Conception Oms, RN ?Phone Number: ?02/04/2022, 9:54 AM ? ?Clinical Narrative:    ?Spoke with the patient and reviewed the bed offers, she chose [facility], Sent information to [facility] to get Ins approval ?Ins auth approved [id number] ?I notified leslie at [facility] ? ? ?  ?  ? ?Expected Discharge Plan and Services ?  ?  ?  ?  ?  ?                ?  ?  ?  ?  ?  ?  ?  ?  ?  ?  ? ? ?Social Determinants of Health (SDOH) Interventions ?  ? ?Readmission Risk Interventions ?No flowsheet data found. ? ?

## 2022-02-12 LAB — ABO/RH: ABO/RH(D): A POS

## 2022-06-17 ENCOUNTER — Encounter: Payer: Self-pay | Admitting: Dermatology

## 2022-06-17 ENCOUNTER — Ambulatory Visit: Payer: Medicare Other | Admitting: Dermatology

## 2022-06-17 DIAGNOSIS — D1800 Hemangioma unspecified site: Secondary | ICD-10-CM

## 2022-06-17 DIAGNOSIS — D692 Other nonthrombocytopenic purpura: Secondary | ICD-10-CM | POA: Diagnosis not present

## 2022-06-17 DIAGNOSIS — L918 Other hypertrophic disorders of the skin: Secondary | ICD-10-CM

## 2022-06-17 DIAGNOSIS — Z85828 Personal history of other malignant neoplasm of skin: Secondary | ICD-10-CM

## 2022-06-17 DIAGNOSIS — L814 Other melanin hyperpigmentation: Secondary | ICD-10-CM

## 2022-06-17 DIAGNOSIS — Z1283 Encounter for screening for malignant neoplasm of skin: Secondary | ICD-10-CM

## 2022-06-17 DIAGNOSIS — L821 Other seborrheic keratosis: Secondary | ICD-10-CM

## 2022-06-17 DIAGNOSIS — D229 Melanocytic nevi, unspecified: Secondary | ICD-10-CM | POA: Diagnosis not present

## 2022-06-17 DIAGNOSIS — L578 Other skin changes due to chronic exposure to nonionizing radiation: Secondary | ICD-10-CM | POA: Diagnosis not present

## 2022-06-17 NOTE — Progress Notes (Signed)
Follow-Up Visit   Subjective  Tracy Harvey is a 76 y.o. female who presents for the following: Annual Exam (Hx of BCC, AK's ). The patient presents for Total-Body Skin Exam (TBSE) for skin cancer screening and mole check.  The patient has spots, moles and lesions to be evaluated, some may be new or changing and the patient has concerns that these could be cancer.  The following portions of the chart were reviewed this encounter and updated as appropriate:   Tobacco  Allergies  Meds  Problems  Med Hx  Surg Hx  Fam Hx     Review of Systems:  No other skin or systemic complaints except as noted in HPI or Assessment and Plan.  Objective  Well appearing patient in no apparent distress; mood and affect are within normal limits.  A full examination was performed including scalp, head, eyes, ears, nose, lips, neck, chest, axillae, abdomen, back, buttocks, bilateral upper extremities, bilateral lower extremities, hands, feet, fingers, toes, fingernails, and toenails. All findings within normal limits unless otherwise noted below.   Assessment & Plan  Actinic skin damage  Skin cancer screening  Seborrheic keratosis  Melanocytic nevus, unspecified location   Lentigines - Scattered tan macules - Due to sun exposure - Benign-appearing, observe - Recommend daily broad spectrum sunscreen SPF 30+ to sun-exposed areas, reapply every 2 hours as needed. - Call for any changes  Seborrheic Keratoses - Stuck-on, waxy, tan-brown papules and/or plaques  - Benign-appearing - Discussed benign etiology and prognosis. - Observe - Call for any changes  Melanocytic Nevi - Tan-brown and/or pink-flesh-colored symmetric macules and papules - Benign appearing on exam today - Observation - Call clinic for new or changing moles - Recommend daily use of broad spectrum spf 30+ sunscreen to sun-exposed areas.   Hemangiomas - Red papules - Discussed benign nature - Observe - Call for any  changes  Actinic Damage - Chronic condition, secondary to cumulative UV/sun exposure - diffuse scaly erythematous macules with underlying dyspigmentation - Recommend daily broad spectrum sunscreen SPF 30+ to sun-exposed areas, reapply every 2 hours as needed.  - Staying in the shade or wearing long sleeves, sun glasses (UVA+UVB protection) and wide brim hats (4-inch brim around the entire circumference of the hat) are also recommended for sun protection.  - Call for new or changing lesions.  Acrochordons (Skin Tags) - Fleshy, skin-colored pedunculated papules - Benign appearing.  - Observe. - If desired, they can be removed with an in office procedure that is not covered by insurance. - Please call the clinic if you notice any new or changing lesions.  Purpura - Chronic; persistent and recurrent.  Treatable, but not curable. - Violaceous macules and patches - Benign - Related to trauma, age, sun damage and/or use of blood thinners, chronic use of topical and/or oral steroids - Observe - Can use OTC arnica containing moisturizer such as Dermend Bruise Formula if desired - Call for worsening or other concerns  History of Basal Cell Carcinoma of the Skin - face, no pathology available - No evidence of recurrence today - Recommend regular full body skin exams - Recommend daily broad spectrum sunscreen SPF 30+ to sun-exposed areas, reapply every 2 hours as needed.  - Call if any new or changing lesions are noted between office visits  Skin cancer screening performed today.  Return in about 1 year (around 06/18/2023).  Luther Redo, CMA, am acting as scribe for Sarina Ser, MD . Documentation: I have reviewed the  above documentation for accuracy and completeness, and I agree with the above.  Sarina Ser, MD

## 2022-06-17 NOTE — Patient Instructions (Addendum)
Purpura - Chronic; persistent and recurrent.  Treatable, but not curable. - Violaceous macules and patches - Benign - Related to trauma, age, sun damage and/or use of blood thinners, chronic use of topical and/or oral steroids - Observe - Can use OTC arnica containing moisturizer such as Dermend Bruise Formula if desired - Call for worsening or other concerns   Due to recent changes in healthcare laws, you may see results of your pathology and/or laboratory studies on MyChart before the doctors have had a chance to review them. We understand that in some cases there may be results that are confusing or concerning to you. Please understand that not all results are received at the same time and often the doctors may need to interpret multiple results in order to provide you with the best plan of care or course of treatment. Therefore, we ask that you please give us 2 business days to thoroughly review all your results before contacting the office for clarification. Should we see a critical lab result, you will be contacted sooner.   If You Need Anything After Your Visit  If you have any questions or concerns for your doctor, please call our main line at [phone] and press option 4 to reach your doctor's medical assistant. If no one answers, please leave a voicemail as directed and we will return your call as soon as possible. Messages left after 4 pm will be answered the following business day.   You may also send us a message via MyChart. We typically respond to MyChart messages within 1-2 business days.  For prescription refills, please ask your pharmacy to contact our office. Our fax number is [phone].  If you have an urgent issue when the clinic is closed that cannot wait until the next business day, you can page your doctor at the number below.    Please note that while we do our best to be available for urgent issues outside of office hours, we are not available 24/7.   If you  have an urgent issue and are unable to reach us, you may choose to seek medical care at your doctor's office, retail clinic, urgent care center, or emergency room.  If you have a medical emergency, please immediately call 911 or go to the emergency department.  Pager Numbers  - Dr. Kowalski: [phone]  - Dr. Moye: [phone]  - Dr. Stewart: [phone]  In the event of inclement weather, please call our main line at [phone] for an update on the status of any delays or closures.  Dermatology Medication Tips: Please keep the boxes that topical medications come in in order to help keep track of the instructions about where and how to use these. Pharmacies typically print the medication instructions only on the boxes and not directly on the medication tubes.   If your medication is too expensive, please contact our office at [phone] option 4 or send us a message through MyChart.   We are unable to tell what your co-pay for medications will be in advance as this is different depending on your insurance coverage. However, we may be able to find a substitute medication at lower cost or fill out paperwork to get insurance to cover a needed medication.   If a prior authorization is required to get your medication covered by your insurance company, please allow us 1-2 business days to complete this process.  Drug prices often vary depending on where the prescription is filled and some pharmacies may offer cheaper   prices.  The website www.goodrx.com contains coupons for medications through different pharmacies. The prices here do not account for what the cost may be with help from insurance (it may be cheaper with your insurance), but the website can give you the price if you did not use any insurance.  - You can print the associated coupon and take it with your prescription to the pharmacy.  - You may also stop by our office during regular business hours and pick up a GoodRx coupon  card.  - If you need your prescription sent electronically to a different pharmacy, notify our office through [facility] [place] MyChart or by phone at [phone] option 4.     Si Usted Necesita Algo Despus de Su Visita  Tambin puede enviarnos un mensaje a travs de MyChart. Por lo general respondemos a los mensajes de MyChart en el transcurso de 1 a 2 das hbiles.  Para renovar recetas, por favor pida a su farmacia que se ponga en contacto con nuestra oficina. Nuestro nmero de fax es el [phone].  Si tiene un asunto urgente cuando la clnica est cerrada y que no puede esperar hasta el siguiente da hbil, puede llamar/localizar a su doctor(a) al nmero que aparece a continuacin.   Por favor, tenga en cuenta que aunque hacemos todo lo posible para estar disponibles para asuntos urgentes fuera del horario de oficina, no estamos disponibles las 24 horas del da, los 7 das de la semana.   Si tiene un problema urgente y no puede comunicarse con nosotros, puede optar por buscar atencin mdica  en el consultorio de su doctor(a), en una clnica privada, en un centro de atencin urgente o en una sala de emergencias.  Si tiene una emergencia mdica, por favor llame inmediatamente al 911 o vaya a la sala de emergencias.  Nmeros de bper  - Dr. Kowalski: [phone]  - Dra. Moye: [phone]  - Dra. Stewart: [phone]  En caso de inclemencias del tiempo, por favor llame a nuestra lnea principal al [phone] para una actualizacin sobre el estado de cualquier retraso o cierre.  Consejos para la medicacin en dermatologa: Por favor, guarde las cajas en las que vienen los medicamentos de uso tpico para ayudarle a seguir las instrucciones sobre dnde y cmo usarlos. Las farmacias generalmente imprimen las instrucciones del medicamento slo en las cajas y no directamente en los tubos del medicamento.   Si su medicamento es muy caro, por favor, pngase en contacto con nuestra  oficina llamando al [phone] y presione la opcin 4 o envenos un mensaje a travs de MyChart.   No podemos decirle cul ser su copago por los medicamentos por adelantado ya que esto es diferente dependiendo de la cobertura de su seguro. Sin embargo, es posible que podamos encontrar un medicamento sustituto a menor costo o llenar un formulario para que el seguro cubra el medicamento que se considera necesario.   Si se requiere una autorizacin previa para que su compaa de seguros cubra su medicamento, por favor permtanos de 1 a 2 das hbiles para completar este proceso.  Los precios de los medicamentos varan con frecuencia dependiendo del lugar de dnde se surte la receta y alguna farmacias pueden ofrecer precios ms baratos.  El sitio web www.goodrx.com tiene cupones para medicamentos de diferentes farmacias. Los precios aqu no tienen en cuenta lo que podra costar con la ayuda del seguro (puede ser ms barato con su seguro), pero el sitio web puede darle el precio si no utiliz ningn seguro.  -   Puede imprimir el cupn correspondiente y llevarlo con su receta a la farmacia.  - Tambin puede pasar por nuestra oficina durante el horario de atencin regular y recoger una tarjeta de cupones de GoodRx.  - Si necesita que su receta se enve electrnicamente a una farmacia diferente, informe a nuestra oficina a travs de MyChart de [place] o por telfono llamando al [phone] y presione la opcin 4.  

## 2023-01-06 ENCOUNTER — Encounter: Payer: Self-pay | Admitting: Internal Medicine

## 2023-01-06 DIAGNOSIS — Z1231 Encounter for screening mammogram for malignant neoplasm of breast: Secondary | ICD-10-CM

## 2023-01-09 ENCOUNTER — Other Ambulatory Visit: Payer: Self-pay | Admitting: Internal Medicine

## 2023-01-09 DIAGNOSIS — N6321 Unspecified lump in the left breast, upper outer quadrant: Secondary | ICD-10-CM

## 2023-01-15 ENCOUNTER — Ambulatory Visit
Admission: RE | Admit: 2023-01-15 | Discharge: 2023-01-15 | Disposition: A | Discharge status: Home / Self Care | Payer: Medicare Other | Source: Ambulatory Visit | Attending: Internal Medicine | Admitting: Internal Medicine

## 2023-01-15 DIAGNOSIS — N6321 Unspecified lump in the left breast, upper outer quadrant: Secondary | ICD-10-CM | POA: Diagnosis present

## 2023-07-23 ENCOUNTER — Ambulatory Visit: Payer: Medicare Other | Admitting: Dermatology

## 2023-12-17 ENCOUNTER — Other Ambulatory Visit: Payer: Self-pay | Admitting: Internal Medicine

## 2023-12-17 DIAGNOSIS — Z1231 Encounter for screening mammogram for malignant neoplasm of breast: Secondary | ICD-10-CM

## 2024-02-03 ENCOUNTER — Ambulatory Visit
Admission: RE | Admit: 2024-02-03 | Discharge: 2024-02-03 | Disposition: A | Discharge status: Home / Self Care | Payer: Self-pay | Source: Ambulatory Visit | Attending: Internal Medicine | Admitting: Internal Medicine

## 2024-02-03 DIAGNOSIS — Z1231 Encounter for screening mammogram for malignant neoplasm of breast: Secondary | ICD-10-CM | POA: Diagnosis present

## 2024-05-10 ENCOUNTER — Other Ambulatory Visit (INDEPENDENT_AMBULATORY_CARE_PROVIDER_SITE_OTHER): Payer: Self-pay | Admitting: Nurse Practitioner

## 2024-05-10 DIAGNOSIS — I739 Peripheral vascular disease, unspecified: Secondary | ICD-10-CM

## 2024-05-12 ENCOUNTER — Encounter (INDEPENDENT_AMBULATORY_CARE_PROVIDER_SITE_OTHER): Payer: Self-pay | Admitting: Vascular Surgery

## 2024-05-12 ENCOUNTER — Ambulatory Visit (INDEPENDENT_AMBULATORY_CARE_PROVIDER_SITE_OTHER)

## 2024-05-12 ENCOUNTER — Ambulatory Visit (INDEPENDENT_AMBULATORY_CARE_PROVIDER_SITE_OTHER): Payer: Self-pay | Admitting: Vascular Surgery

## 2024-05-12 VITALS — BP 148/70 | HR 74 | Ht 60.0 in | Wt 141.2 lb

## 2024-05-12 DIAGNOSIS — K219 Gastro-esophageal reflux disease without esophagitis: Secondary | ICD-10-CM | POA: Diagnosis not present

## 2024-05-12 DIAGNOSIS — T8189XA Other complications of procedures, not elsewhere classified, initial encounter: Secondary | ICD-10-CM

## 2024-05-12 DIAGNOSIS — I10 Essential (primary) hypertension: Secondary | ICD-10-CM

## 2024-05-12 DIAGNOSIS — I739 Peripheral vascular disease, unspecified: Secondary | ICD-10-CM | POA: Diagnosis not present

## 2024-05-12 NOTE — Progress Notes (Signed)
 Subjective:    Patient ID: Tracy Harvey, female    DOB: 1946-11-03, 78 y.o.   MRN: [id number] Chief Complaint  Patient presents with   New Patient (Initial Visit)    np. ABI + consult. claudication. miller, mark.     Tracy Harvey is a 78 yo female who presents to clinic today for possible vascular ischemia to her lower extremities.  Patient endorses for the last 9 months she has been battling an abscess to her left great toe.  Every time it appears it has been healed it comes back.  She has had her toenail removed by Dr. Ashley of podiatry and has seen them many times this year for the recurring problem.  Dr. Cleotilde her primary care has sent her to us  to evaluate for bilateral lower extremity ischemia.  Patient endorses she is not having any pain to her toe today.  She also states that there is no pus or noted infection at this time.  She has no other complaints to her bilateral lower extremities today.    Review of Systems  Constitutional: Negative.   Skin:  Positive for wound.       Recurrent left great toe abscess/infection  All other systems reviewed and are negative.      Objective:   Physical Exam Vitals reviewed.  Constitutional:      Appearance: Normal appearance. She is normal weight.  HENT:     Head: Normocephalic.   Eyes:     Pupils: Pupils are equal, round, and reactive to light.    Cardiovascular:     Rate and Rhythm: Normal rate and regular rhythm.     Pulses: Normal pulses.     Heart sounds: Normal heart sounds.  Pulmonary:     Effort: Pulmonary effort is normal.     Breath sounds: Normal breath sounds.  Abdominal:     General: Abdomen is flat.     Palpations: Abdomen is soft.   Musculoskeletal:        General: Tenderness present.     Comments: Positive tenderness to left great toe abscess/infection   Skin:    General: Skin is warm and dry.     Capillary Refill: Capillary refill takes 2 to 3 seconds.   Neurological:     General: No focal  deficit present.     Mental Status: She is alert and oriented to person, place, and time. Mental status is at baseline.   Psychiatric:        Mood and Affect: Mood normal.        Behavior: Behavior normal.        Thought Content: Thought content normal.        Judgment: Judgment normal.     BP (!) 148/70   Pulse 74   Ht 5' (1.524 m)   Wt 141 lb 3.2 oz (64 kg)   BMI 27.58 kg/m   Past Medical History:  Diagnosis Date   Arthritis    Asthma    Basal cell carcinoma    face   Cancer (HCC)    SKIN CANCER-BASAL CELL   Complication of anesthesia    Dysrhythmia    PACs, SVT   Foot fracture, left    WEARING BRACE   GERD (gastroesophageal reflux disease)    Headache    H/O MIGRAINES   History of hiatal hernia    Hypertension    IBS (irritable bowel syndrome)    PONV (postoperative nausea and vomiting)  Shortness of breath dyspnea    Wheezing    OCC    Social History   Socioeconomic History   Marital status: Widowed    Spouse name: Not on file   Number of children: Not on file   Years of education: Not on file   Highest education level: Not on file  Occupational History   Not on file  Tobacco Use   Smoking status: Never   Smokeless tobacco: Never  Vaping Use   Vaping status: Never Used  Substance and Sexual Activity   Alcohol use: No   Drug use: Never   Sexual activity: Not on file  Other Topics Concern   Not on file  Social History Narrative   Not on file   Social Drivers of Health   Financial Resource Strain: Low Risk  (05/12/2024)   Received from [facility]   Overall Financial Resource Strain (CARDIA)    Difficulty of Paying Living Expenses: Not hard at all  Food Insecurity: No Food Insecurity (05/12/2024)   Received from [facility]   Hunger Vital Sign    Within the past 12 months, you worried that your food would run out before you got the money to buy more.: Never true    Within the past 12 months, the food you  bought just didn't last and you didn't have money to get more.: Never true  Transportation Needs: No Transportation Needs (05/12/2024)   Received from [facility] - Transportation    In the past 12 months, has lack of transportation kept you from medical appointments or from getting medications?: No    Lack of Transportation (Non-Medical): No  Physical Activity: Insufficiently Active (04/03/2023)   Received from [facility]   Exercise Vital Sign    On average, how many days per week do you engage in moderate to strenuous exercise (like a brisk walk)?: 2 days    On average, how many minutes do you engage in exercise at this level?: 30 min  Stress: No Stress Concern Present (04/03/2023)   Received from [facility] - Occupational Stress Questionnaire    Feeling of Stress : Not at all  Social Connections: Moderately Isolated (04/03/2023)   Received from [facility]   Social Connection and Isolation Panel    In a typical week, how many times do you talk on the phone with family, friends, or neighbors?: More than three times a week    How often do you get together with friends or relatives?: Twice a week    How often do you attend church or religious services?: More than 4 times per year    Do you belong to any clubs or organizations such as church groups, unions, fraternal or athletic groups, or school groups?: No    How often do you attend meetings of the clubs or organizations you belong to?: Never    Are you married, widowed, divorced, separated, never married, or living with a partner?: Widowed  Intimate Partner Violence: Not on file    Past Surgical History:  Procedure Laterality Date   ABDOMINAL HYSTERECTOMY     BONE EXCISION Left 04/27/2020   Procedure: PARTIAL EXCISION BONE-PHALANX;  Surgeon: Lilli Cough, DPM;  Location: [facility];  Service:  Podiatry;  Laterality: Left;  IVA LOCAL   CATARACT EXTRACTION W/PHACO Right 02/05/2016   Procedure: CATARACT EXTRACTION PHACO AND INTRAOCULAR  LENS PLACEMENT (IOC);  Surgeon: Steven Dingeldein, MD;  Location: [facility];  Service: Ophthalmology;  Laterality: Right;  US  01:02 AP% 23.9 CDE 25.0 fluid pack lot # [id number]   CATARACT EXTRACTION W/PHACO Left 10/26/2020   Procedure: CATARACT EXTRACTION PHACO AND INTRAOCULAR LENS PLACEMENT (IOC) LEFT 6.73 00:37.8;  Surgeon: Jaye Fallow, MD;  Location: [facility];  Service: Ophthalmology;  Laterality: Left;   colonoscopy with polypectomy     COLONOSCOPY WITH PROPOFOL  N/A 02/03/2019   Procedure: COLONOSCOPY WITH PROPOFOL ;  Surgeon: Viktoria Lamar DASEN, MD;  Location: [facility];  Service: Endoscopy;  Laterality: N/A;   ESOPHAGOGASTRODUODENOSCOPY     FOOT SURGERY     HAND SURGERY     HIP ARTHROPLASTY Right    INTRAMEDULLARY (IM) NAIL INTERTROCHANTERIC Right 02/01/2022   Procedure: INTRAMEDULLARY (IM) NAIL INTERTROCHANTRIC;  Surgeon: Edie Norleen PARAS, MD;  Location: [facility];  Service: Orthopedics;  Laterality: Right;   MOUTH SURGERY     NASAL SINUS SURGERY     ORIF ELBOW FRACTURE Left 11/26/2019   Procedure: OPEN REDUCTION INTERNAL FIXATION (ORIF) ELBOW/OLECRANON FRACTURE;  Surgeon: Mardee Lynwood SQUIBB, MD;  Location: [facility];  Service: Orthopedics;  Laterality: Left;   TONSILLECTOMY      Family History  Problem Relation Age of Onset   Breast cancer Other     Allergies  Allergen Reactions   Augmentin [Amoxicillin-Pot Clavulanate] Nausea And Vomiting    Did it involve swelling of the face/tongue/throat, SOB, or low BP? No Did it involve sudden or severe rash/hives, skin peeling, or any reaction on the inside of your mouth or nose? No Did you need to seek medical attention at a hospital or doctor's office? No When did it last happen? More than 20 years ago     If all above answers are "NO", may proceed with cephalosporin use.     Chocolate Flavoring Agent (Non-Screening) Other (See Comments)    Headaches.   Egg [Egg-Derived Products] Nausea And Vomiting and Other (See Comments)    Severe migraines.   Eryc [Erythromycin] Other (See Comments)    Unknown reaction   Morphine  And Codeine Nausea And Vomiting    Vomiting for days.   Tilactase Other (See Comments)    Unknown reaction.       Latest Ref Rng & Units 02/04/2022    3:56 AM 02/03/2022    4:06 AM 02/02/2022    4:04 AM  CBC  WBC 4.0 - 10.5 K/uL 9.6  9.8  11.8   Hemoglobin 12.0 - 15.0 g/dL 89.4  89.7  89.3   Hematocrit 36.0 - 46.0 % 32.1  30.5  31.8   Platelets 150 - 400 K/uL 294  281  282       CMP     Component Value Date/Time   NA 130 (L) 02/04/2022 0356   K 4.5 02/04/2022 0356   CL 102 02/04/2022 0356   CO2 22 02/04/2022 0356   GLUCOSE 104 (H) 02/04/2022 0356   BUN 15 02/04/2022 0356   CREATININE 0.67 02/04/2022 0356   CALCIUM  8.0 (L) 02/04/2022 0356   PROT 7.3 02/01/2022 1440   ALBUMIN 3.8 02/01/2022 1440   AST 40 02/01/2022 1440   ALT 34 02/01/2022 1440   ALKPHOS 57 02/01/2022 1440   BILITOT 0.8 02/01/2022 1440   GFRNONAA >60 02/04/2022 0356     No results found.     Assessment & Plan:   1. Non-healing surgical wound, initial encounter (Primary) Patient underwent bilateral lower extremity arterial ultrasounds with  ABIs today.  These are all well within normal limits with triphasic pulses.  She does not have any vascular insufficiency arterially to her feet.  Her feet are warm to touch which also indicates she has got good blood flow to her toes.  Unfortunately this recurrent abscess/infection is not related to the lack of blood supply at this time.  Patient can follow-up with us  as needed in the future for any recurrent problems.  2. Essential hypertension Continue antihypertensive medications as already ordered, these medications have been reviewed and there are no changes at this time.  3. Gastroesophageal reflux disease without  esophagitis Continue PPI as already ordered, this medication has been reviewed and there are no changes at this time.  Avoidence of caffeine and alcohol  Moderate elevation of the head of the bed    Current Outpatient Medications on File Prior to Visit  Medication Sig Dispense Refill   ASPIRIN  81 PO Take by mouth.     diltiazem  (CARDIZEM  CD) 180 MG 24 hr capsule Take 180 mg by mouth daily.     enoxaparin  (LOVENOX ) 40 MG/0.4ML injection Inject 0.4 mLs (40 mg total) into the skin daily for 14 days. 5.6 mL 0   estradiol  (ESTRACE ) 1 MG tablet Take 1 mg by mouth daily after breakfast.      furosemide (LASIX) 20 MG tablet Take 20 mg by mouth daily.     losartan-hydrochlorothiazide (HYZAAR) 100-12.5 MG tablet Take 1 tablet by mouth daily.     montelukast  (SINGULAIR ) 10 MG tablet Take 10 mg by mouth at bedtime.     omeprazole (PRILOSEC) 20 MG capsule Take 20 mg by mouth daily after breakfast.      verapamil  (CALAN ) 120 MG tablet Take 0.5 tablets (60 mg total) by mouth 2 (two) times daily.     No current facility-administered medications on file prior to visit.    There are no Patient Instructions on file for this visit. No follow-ups on file.   Gwendlyn JONELLE Shank, NP

## 2024-05-14 LAB — VAS US ABI WITH/WO TBI
Left ABI: 1.04
Right ABI: 1.16

## 2024-09-16 ENCOUNTER — Other Ambulatory Visit: Payer: Self-pay | Admitting: Podiatry

## 2024-09-16 DIAGNOSIS — L02612 Cutaneous abscess of left foot: Secondary | ICD-10-CM

## 2024-09-17 ENCOUNTER — Encounter: Payer: Self-pay | Admitting: Podiatry

## 2024-09-23 ENCOUNTER — Ambulatory Visit
Admission: RE | Admit: 2024-09-23 | Discharge: 2024-09-23 | Disposition: A | Discharge status: Home / Self Care | Source: Ambulatory Visit | Attending: Podiatry | Admitting: Podiatry

## 2024-09-23 DIAGNOSIS — L02612 Cutaneous abscess of left foot: Secondary | ICD-10-CM

## 2024-09-24 ENCOUNTER — Other Ambulatory Visit

## 2024-09-30 ENCOUNTER — Encounter: Payer: Self-pay | Admitting: Infectious Diseases

## 2024-09-30 ENCOUNTER — Ambulatory Visit: Attending: Infectious Diseases | Admitting: Infectious Diseases

## 2024-09-30 VITALS — BP 138/77 | HR 84 | Temp 97.2°F

## 2024-09-30 DIAGNOSIS — R609 Edema, unspecified: Secondary | ICD-10-CM | POA: Diagnosis not present

## 2024-09-30 DIAGNOSIS — M810 Age-related osteoporosis without current pathological fracture: Secondary | ICD-10-CM | POA: Insufficient documentation

## 2024-09-30 DIAGNOSIS — I10 Essential (primary) hypertension: Secondary | ICD-10-CM | POA: Diagnosis not present

## 2024-09-30 DIAGNOSIS — M869 Osteomyelitis, unspecified: Secondary | ICD-10-CM | POA: Diagnosis not present

## 2024-09-30 DIAGNOSIS — J45909 Unspecified asthma, uncomplicated: Secondary | ICD-10-CM | POA: Diagnosis not present

## 2024-09-30 DIAGNOSIS — L03032 Cellulitis of left toe: Secondary | ICD-10-CM

## 2024-09-30 DIAGNOSIS — L089 Local infection of the skin and subcutaneous tissue, unspecified: Secondary | ICD-10-CM

## 2024-09-30 DIAGNOSIS — Z79899 Other long term (current) drug therapy: Secondary | ICD-10-CM | POA: Insufficient documentation

## 2024-09-30 NOTE — Patient Instructions (Signed)
 You came in today due to a persistent infection in your left great toe, which has been ongoing since February. Despite multiple procedures and a recent course of antibiotics, the infection continues to cause pus formation and redness. You have a history of foot surgeries and other health conditions, including high blood pressure, asthma, and osteoporosis.  YOUR PLAN:  -RECURRENT PARONYCHIA AND CELLULITIS OF LEFT GREAT TOE: Recurrent paronychia and cellulitis mean that the skin around your toenail and the underlying tissues are repeatedly getting infected. We are waiting for culture results to determine the best antibiotic for you. In the meantime, continue using the prescribed antibiotic cream and keep the toe bandaged. Avoid showering without a plastic bag to protect the toe. We may consider a combination of ciprofloxacin and linezolid based on the culture results. Blood work will be done next week to monitor your kidney and liver function if we start the new antibiotics.  INSTRUCTIONS:  Await culture results to determine the appropriate antibiotic therapy. Continue using the prescribed antibiotic cream and bandaging. Avoid showering without a plastic bag to protect the toe. Blood work will be done next week to monitor kidney and liver function if combination therapy is initiated.

## 2024-09-30 NOTE — Progress Notes (Signed)
 NAME: Tracy Harvey  DOB: [birth year]  MRN: [id number]  Date/Time: 09/30/2024 12:02 PM   Subjective:   ?pt agreed to the use of AI scribe  Tracy Harvey is a 78 y.o. with a history of asthma, HTN foot surgeries who presents with a persistent infection in the left great toe. She was referred by Dr. Ashley.  The issue began in February with an abscess near the nail of the left great toe. She has undergone multiple procedures, including nail removal and lancing of the abscess, performed in the office setting. Despite these interventions, pus formation and redness around the nail bed persist.  An MRI was performed on November 6th which questioned osteomyelitis of the distal phlanx of great toe She completed a 14-day course of cefadroxil 500 mg twice daily this past Monday. Side effects from the antibiotic included diarrhea, itching, and yeast infections.  She has a history of bunion repair and hammer toe surgery on the same foot. She denies having diabetes but has a history of high blood pressure, for which she takes multiple medications. She also has asthma and osteoporosis, managed with montelukast , albuterol , budesonide, and Prolia injections.  She lives alone and has a daughter who lives nearby. No fever or chills are reported, but the infection is described as 'aggravating and painful at times'. She has been diligent with dressing changes and using prescribed antibiotic cream, but the infection persists  Past Medical History:  Diagnosis Date   Arthritis    Asthma    Basal cell carcinoma    face   Cancer (HCC)    SKIN CANCER-BASAL CELL   Complication of anesthesia    Dysrhythmia    PACs, SVT   Foot fracture, left    WEARING BRACE   GERD (gastroesophageal reflux disease)    Headache    H/O MIGRAINES   History of hiatal hernia    Hypertension    IBS (irritable bowel syndrome)    PONV (postoperative nausea and vomiting)    Shortness of breath dyspnea    Wheezing    OCC     Past Surgical History:  Procedure Laterality Date   ABDOMINAL HYSTERECTOMY     BONE EXCISION Left 04/27/2020   Procedure: PARTIAL EXCISION BONE-PHALANX;  Surgeon: Lilli Cough, DPM;  Location: [facility];  Service: Podiatry;  Laterality: Left;  IVA LOCAL   CATARACT EXTRACTION W/PHACO Right 02/05/2016   Procedure: CATARACT EXTRACTION PHACO AND INTRAOCULAR LENS PLACEMENT (IOC);  Surgeon: Steven Dingeldein, MD;  Location: [facility];  Service: Ophthalmology;  Laterality: Right;  US  01:02 AP% 23.9 CDE 25.0 fluid pack lot # [id number]   CATARACT EXTRACTION W/PHACO Left 10/26/2020   Procedure: CATARACT EXTRACTION PHACO AND INTRAOCULAR LENS PLACEMENT (IOC) LEFT 6.73 00:37.8;  Surgeon: Jaye Fallow, MD;  Location: [facility];  Service: Ophthalmology;  Laterality: Left;   colonoscopy with polypectomy     COLONOSCOPY WITH PROPOFOL  N/A 02/03/2019   Procedure: COLONOSCOPY WITH PROPOFOL ;  Surgeon: Viktoria Lamar DASEN, MD;  Location: [facility];  Service: Endoscopy;  Laterality: N/A;   ESOPHAGOGASTRODUODENOSCOPY     FOOT SURGERY     HAND SURGERY     HIP ARTHROPLASTY Right    INTRAMEDULLARY (IM) NAIL INTERTROCHANTERIC Right 02/01/2022   Procedure: INTRAMEDULLARY (IM) NAIL INTERTROCHANTRIC;  Surgeon: Edie Norleen PARAS, MD;  Location: [facility];  Service: Orthopedics;  Laterality: Right;   MOUTH SURGERY     NASAL SINUS SURGERY     ORIF ELBOW FRACTURE Left 11/26/2019   Procedure: OPEN  REDUCTION INTERNAL FIXATION (ORIF) ELBOW/OLECRANON FRACTURE;  Surgeon: Mardee Lynwood SQUIBB, MD;  Location: [facility];  Service: Orthopedics;  Laterality: Left;   TONSILLECTOMY      Social History   Socioeconomic History   Marital status: Widowed    Spouse name: Not on file   Number of children: Not on file   Years of education: Not on file   Highest education level: Not on file  Occupational History   Not on file  Tobacco Use   Smoking status: Never   Smokeless tobacco: Never  Vaping Use   Vaping  status: Never Used  Substance and Sexual Activity   Alcohol use: No   Drug use: Never   Sexual activity: Not on file  Other Topics Concern   Not on file  Social History Narrative   Not on file   Social Drivers of Health   Financial Resource Strain: Low Risk  (06/04/2024)   Received from [facility]   Overall Financial Resource Strain (CARDIA)    Difficulty of Paying Living Expenses: Not hard at all  Food Insecurity: No Food Insecurity (06/04/2024)   Received from [facility]   Hunger Vital Sign    Within the past 12 months, you worried that your food would run out before you got the money to buy more.: Never true    Within the past 12 months, the food you bought just didn't last and you didn't have money to get more.: Never true  Transportation Needs: No Transportation Needs (06/04/2024)   Received from [facility] - Transportation    In the past 12 months, has lack of transportation kept you from medical appointments or from getting medications?: No    Lack of Transportation (Non-Medical): No  Physical Activity: Insufficiently Active (04/03/2023)   Received from [facility]   Exercise Vital Sign    On average, how many days per week do you engage in moderate to strenuous exercise (like a brisk walk)?: 2 days    On average, how many minutes do you engage in exercise at this level?: 30 min  Stress: No Stress Concern Present (04/03/2023)   Received from [facility] - Occupational Stress Questionnaire    Feeling of Stress : Not at all  Social Connections: Moderately Isolated (04/03/2023)   Received from [facility]   Social Connection and Isolation Panel    In a typical week, how many times do you talk on the phone with family, friends, or neighbors?: More than three times a week    How often do you get together with friends or  relatives?: Twice a week    How often do you attend church or religious services?: More than 4 times per year    Do you belong to any clubs or organizations such as church groups, unions, fraternal or athletic groups, or school groups?: No    How often do you attend meetings of the clubs or organizations you belong to?: Never    Are you married, widowed, divorced, separated, never married, or living with a partner?: Widowed  Intimate Partner Violence: Not on file    Family History  Problem Relation Age of Onset   Breast cancer Other    Allergies  Allergen Reactions   Augmentin [Amoxicillin-Pot Clavulanate] Nausea And Vomiting    Did it involve swelling of the face/tongue/throat, SOB, or low BP? No Did  it involve sudden or severe rash/hives, skin peeling, or any reaction on the inside of your mouth or nose? No Did you need to seek medical attention at a hospital or doctor's office? No When did it last happen? More than 20 years ago     If all above answers are "NO", may proceed with cephalosporin use.    Chocolate Flavoring Agent (Non-Screening) Other (See Comments)    Headaches.   Egg [Egg Protein-Containing Drug Products] Nausea And Vomiting and Other (See Comments)    Severe migraines.   Eryc [Erythromycin] Other (See Comments)    Unknown reaction   Morphine  And Codeine Nausea And Vomiting    Vomiting for days.   Tilactase Other (See Comments)    Unknown reaction.   I? Current Outpatient Medications  Medication Sig Dispense Refill   albuterol  (VENTOLIN  HFA) 108 (90 Base) MCG/ACT inhaler Inhale 2 puffs into the lungs every 4 (four) hours as needed.     ASPIRIN  81 PO Take by mouth.     budesonide-formoterol  (SYMBICORT) 160-4.5 MCG/ACT inhaler Inhale 2 puffs into the lungs 2 (two) times daily.     diltiazem  (CARDIZEM  CD) 180 MG 24 hr capsule Take 180 mg by mouth daily.     estradiol  (ESTRACE ) 1 MG tablet Take 1 mg by mouth daily after breakfast.      losartan-hydrochlorothiazide  (HYZAAR) 100-12.5 MG tablet Take 1 tablet by mouth daily.     montelukast  (SINGULAIR ) 10 MG tablet Take 10 mg by mouth at bedtime.     omeprazole (PRILOSEC) 20 MG capsule Take 20 mg by mouth daily after breakfast.      potassium chloride  (KLOR-CON ) 10 MEQ tablet Take 10 mEq by mouth 2 (two) times daily.     PROLIA 60 MG/ML SOSY injection INJECT 1ML SUBCUTANEOUSLY ONCE FOR ONE DOSE     torsemide (DEMADEX) 20 MG tablet Take 20 mg by mouth daily.     verapamil  (CALAN ) 120 MG tablet Take 0.5 tablets (60 mg total) by mouth 2 (two) times daily.     No current facility-administered medications for this visit.     Montelukast  - Losartan - Hydrochlorothiazide - Estradiol  - Prolia - Verapamil  - Albuterol  inhaler - Budesonide - Fluticasone nasal spray - Triamterene - Torsemide - Prednisone - Benadryl  Abtx:  Anti-infectives (From admission, onward)    None       REVIEW OF SYSTEMS:  Const: negative fever, negative chills, negative weight loss Eyes: negative diplopia or visual changes, negative eye pain ENT: negative coryza, negative sore throat Resp: negative cough, hemoptysis, dyspnea Cards: negative for chest pain, palpitations, lower extremity edema GU: negative for frequency, dysuria and hematuria GI: Negative for abdominal pain, diarrhea, bleeding, constipation Skin: negative for rash and pruritus Heme: negative for easy bruising and gum/nose bleeding MS: negative for myalgias, arthralgias, back pain and muscle weakness Neurolo:negative for headaches, dizziness, vertigo, memory problems  Psych: negative for feelings of anxiety, depression  Endocrine: negative for thyroid , diabetes Allergy/Immunology- as above Objective:  VITALS:  BP 138/77   Pulse 84   Temp (!) 97.2 F (36.2 C) (Temporal)   SpO2 94%   PHYSICAL EXAM:  General: Alert, cooperative, no distress, appears stated age.  Head: Normocephalic, without obvious abnormality, atraumatic. Eyes: Conjunctivae clear,  anicteric sclerae. Pupils are equal ENT Nares normal. No drainage or sinus tenderness. Lips, mucosa, and tongue normal. No Thrush Neck: Supple, symmetrical, no adenopathy, thyroid : non tender no carotid bruit and no JVD. Back: No CVA tenderness. Lungs: Clear to auscultation  bilaterally. No Wheezing or Rhonchi. No rales. Heart: Regular rate and rhythm, no murmur, rub or gallop. Abdomen: Soft, non-tender,not distended. Bowel sounds normal. No masses Extremities: foot Left great toe some swelling , some erythema- no obvious pus  Skin: No rashes or lesions. Or bruising Lymph: Cervical, supraclavicular normal. Neurologic: Grossly non-focal Pertinent Labs Lab Results CBC    Component Value Date/Time   WBC 9.6 02/04/2022 0356   RBC 3.35 (L) 02/04/2022 0356   HGB 10.5 (L) 02/04/2022 0356   HCT 32.1 (L) 02/04/2022 0356   PLT 294 02/04/2022 0356   MCV 95.8 02/04/2022 0356   MCH 31.3 02/04/2022 0356   MCHC 32.7 02/04/2022 0356   RDW 14.0 02/04/2022 0356   LYMPHSABS 1.3 02/04/2022 0356   MONOABS 0.7 02/04/2022 0356   EOSABS 0.0 02/04/2022 0356   BASOSABS 0.1 02/04/2022 0356       Latest Ref Rng & Units 02/04/2022    3:56 AM 02/03/2022    4:31 PM 02/03/2022    4:06 AM  CMP  Glucose 70 - 99 mg/dL 895  874  94   BUN 8 - 23 mg/dL 15  14  13    Creatinine 0.44 - 1.00 mg/dL 9.32  9.10  9.25   Sodium 135 - 145 mmol/L 130  127  127   Potassium 3.5 - 5.1 mmol/L 4.5  3.5  2.8   Chloride 98 - 111 mmol/L 102  97  96   CO2 22 - 32 mmol/L 22  25  23    Calcium  8.9 - 10.3 mg/dL 8.0  8.2  8.6       Microbiology: No results found for this or any previous visit (from the past 240 hours).  Lines and Device Date on insertion # of days DC  Central line     Foley     ETT       IMAGING RESULTS: 09/23/24 MRI reviewed Findings compatible with osteomyelitis of the distal phalanx of the left great toe. Surrounding soft tissue swelling and cutaneous thickening of the mid to distal great toe. No  loculated fluid collection. 2. Remote healed fracture of the mid shaft second metatarsal I have personally reviewed the films ? Impression/Recommendation ?Recurrent paronychia and cellulitis of left great toe Chronic recurrent paronychia and cellulitis of the left great toe since February 2025, with recent exacerbation characterized by pus formation and redness. Previous treatments include cefadroxil, which caused diarrhea and yeast infection. MRI shows pronounced marrow edema of the distal phalanx of the great toe questioning osteomyelitis . No fever or chills reported. Awaiting culture results taken by Dr. Ashley to guide antibiotic therapy. No current indication for amputation of the toe  - Continue using prescribed antibiotic cream and bandaging. - Avoid showering without a plastic bag to protect the toe. - Will consider combination therapy with ciprofloxacin and linezolid if culture comes back negative - Will perform blood work in a week to monitor kidney and liver function if combination therapy is initiated. ? Will get back to her once I have the culture report Discussed with Dr.Fowler  HTN- on ACEI/hydrochlorothiazide/verapamil   Asthma on bronchodilators singular    _________________________________

## 2024-10-02 ENCOUNTER — Other Ambulatory Visit

## 2024-10-12 ENCOUNTER — Ambulatory Visit: Admitting: Infectious Diseases

## 2024-10-29 ENCOUNTER — Other Ambulatory Visit: Payer: Self-pay | Admitting: Podiatry

## 2024-11-09 ENCOUNTER — Encounter
Admission: RE | Admit: 2024-11-09 | Discharge: 2024-11-09 | Disposition: A | Discharge status: Home / Self Care | Source: Ambulatory Visit | Attending: Podiatry | Admitting: Podiatry

## 2024-11-09 ENCOUNTER — Other Ambulatory Visit: Payer: Self-pay

## 2024-11-09 VITALS — Ht 60.0 in | Wt 136.0 lb

## 2024-11-09 DIAGNOSIS — T502X5A Adverse effect of carbonic-anhydrase inhibitors, benzothiadiazides and other diuretics, initial encounter: Secondary | ICD-10-CM

## 2024-11-09 DIAGNOSIS — Z79899 Other long term (current) drug therapy: Secondary | ICD-10-CM

## 2024-11-09 DIAGNOSIS — M86172 Other acute osteomyelitis, left ankle and foot: Secondary | ICD-10-CM | POA: Diagnosis not present

## 2024-11-09 DIAGNOSIS — R9431 Abnormal electrocardiogram [ECG] [EKG]: Secondary | ICD-10-CM | POA: Insufficient documentation

## 2024-11-09 DIAGNOSIS — M8610 Other acute osteomyelitis, unspecified site: Secondary | ICD-10-CM

## 2024-11-09 DIAGNOSIS — Z01818 Encounter for other preprocedural examination: Secondary | ICD-10-CM | POA: Diagnosis present

## 2024-11-09 DIAGNOSIS — Z01812 Encounter for preprocedural laboratory examination: Secondary | ICD-10-CM

## 2024-11-09 DIAGNOSIS — I10 Essential (primary) hypertension: Secondary | ICD-10-CM | POA: Diagnosis not present

## 2024-11-09 DIAGNOSIS — Z01810 Encounter for preprocedural cardiovascular examination: Secondary | ICD-10-CM | POA: Diagnosis not present

## 2024-11-09 HISTORY — DX: Age-related osteoporosis without current pathological fracture: M81.0

## 2024-11-09 HISTORY — DX: Hyperlipidemia, unspecified: E78.5

## 2024-11-09 LAB — CBC
HCT: 41 % (ref 36.0–46.0)
Hemoglobin: 13.8 g/dL (ref 12.0–15.0)
MCH: 30.1 pg (ref 26.0–34.0)
MCHC: 33.7 g/dL (ref 30.0–36.0)
MCV: 89.3 fL (ref 80.0–100.0)
Platelets: 494 K/uL — ABNORMAL HIGH (ref 150–400)
RBC: 4.59 MIL/uL (ref 3.87–5.11)
RDW: 12.7 % (ref 11.5–15.5)
WBC: 15.5 K/uL — ABNORMAL HIGH (ref 4.0–10.5)
nRBC: 0 % (ref 0.0–0.2)

## 2024-11-09 LAB — BASIC METABOLIC PANEL WITH GFR
Anion gap: 16 — ABNORMAL HIGH (ref 5–15)
BUN: 27 mg/dL — ABNORMAL HIGH (ref 8–23)
CO2: 28 mmol/L (ref 22–32)
Calcium: 10 mg/dL (ref 8.9–10.3)
Chloride: 93 mmol/L — ABNORMAL LOW (ref 98–111)
Creatinine, Ser: 1.3 mg/dL — ABNORMAL HIGH (ref 0.44–1.00)
GFR, Estimated: 42 mL/min — ABNORMAL LOW
Glucose, Bld: 101 mg/dL — ABNORMAL HIGH (ref 70–99)
Potassium: 2.9 mmol/L — ABNORMAL LOW (ref 3.5–5.1)
Sodium: 136 mmol/L (ref 135–145)

## 2024-11-09 NOTE — Progress Notes (Signed)
 " [facility] Perioperative Services: Pre-Admission/Anesthesia Testing  Abnormal Lab Notification and Treatment Plan of Care   Date: 11/09/2024  Name: Tracy Harvey DOB: [birth year] MRN:   [id number]  Re: Abnormal labs noted during PAT appointment   Notified:  Provider Name Provider Role Notification Mode  Ashley Soulier, DPM Podiatry (Surgeon) Routed and/or faxed via RANELL Cleotilde Oneil JULIANNA, MD Primary Care Provider Routed and/or faxed via [facility]   Clinical Information and Notes:  ABNORMAL LAB VALUE(S): Lab Results  Component Value Date   K 2.9 (L) 11/09/2024   Tracy Harvey is scheduled for an elective AMPUTATION, TOE (Left: Toe) on 11/19/2024. In review of her medication reconciliation, it is noted that the patient is taking prescribed diuretic medications (torsemide 30 mg and triamterene-HCTZ 75/50 mg) daily.   Please note, in efforts to promote a safe and effective anesthetic course, per current guidelines/standards set by the [facility] anesthesia team, the minimal acceptable K+ level for the patient to proceed with general anesthesia is 3.0 mmol/L. With that being said, if the patient drops any lower, her elective procedure will need to be postponed until K+ is better optimized. In efforts to prevent case cancellation, and ultimately to promote the safety of this patient undergoing sedation/anesthesia, will make efforts to optimize pre-surgical K+ level allowing the surgical intervention to proceed as planned.    Impression and Plan:  Tracy Harvey found to be HYPOkalemic at 2.9 mmol/L on preoperative labs. Creatinine 1.30 mg/dL.  She is on daily diuretic therapy. Additionally, patient takes a daily K+ supplement (KDur 10 mEq BID). Discussed diuretic therapy as likely etiology in the absence of GI related symptoms (no diarrhea). Patient denies regular use of laxative medications. Reviewed other potential causes, including decreased intake of dietary K+ that could  result in increased insensible losses. Reviewed plans for short term preoperative optimization as follows:   Meds ordered this encounter  Medications   potassium chloride  (KLOR-CON  M) 20 MEQ tablet    Sig: Take 2 tablets (40 mEq), then 1 tablet (20 mEq) BID starting tomorrow. Continue through your day of surgery. Take dose of day of surgery.    Dispense:  20 tablet    Refill:  0    Please explain new dosing to patient. Make sure she understands that I increased dose to reduced pill burden,    Encouraged patient to follow up with PCP about 2-3 weeks postoperatively to have labs rechecked to ensure that levels are remaining within normal range. Discussed nutritional intake of K+ rich foods as an adjunctive way to keep her K+ levels normal; list of K+ rich foods provided. Also mentioned ORS, however advised her not to rely solely on these drinks, as they are high in Na+, and she has a HTN diagnosis.   Will send copy of this note to surgeon and PCP to make them aware of K+ level and plans for correction. Discussed that PCP may elect to pursue a change in diuretic therapy, or alternatively, they may consider increasing her daily K+ supplement dose if levels remain low on recheck. Order entered to recheck K+ on the day of her surgery to ensure optimization. Wished patient the best of luck with her upcoming surgery and subsequent recovery. She was encouraged to return call to the PAT clinic, or to her surgeon's office, should any questions or concerns arise between now and the time of her surgery. Patient was appreciative of the care/concern expressed by PAT staff.   Encounter Diagnoses  Name Primary?   Pre-operative laboratory examination Yes   Diuretic-induced hypokalemia    Long term current use of diuretic    Tracy Hutzler, MSN, APRN, FNP-C, CEN [facility]  Perioperative Services Nurse Practitioner Phone: [phone] 11/09/2024 3:24 PM  NOTE: This note has been prepared  using Dragon dictation software. Despite my best ability to proofread, there is always the potential that unintentional transcriptional errors may still occur from this process. "

## 2024-11-09 NOTE — Patient Instructions (Addendum)
 Your procedure is scheduled on: FRIDAY 11/20/23 Report to the Registration Desk on the 1st floor of the [facility]. To find out your arrival time, please call [phone] between 1PM - 3PM on: THURSDAY 11/19/23 If your arrival time is 6:00 am, do not arrive before that time as the [facility] entrance doors do not open until 6:00 am.  REMEMBER: Instructions that are not followed completely may result in serious medical risk, up to and including death; or upon the discretion of your surgeon and anesthesiologist your surgery may need to be rescheduled.  Do not eat food after midnight the night before surgery.  No gum chewing or hard candies.  You may however, drink CLEAR liquids up to 2 hours before you are scheduled to arrive for your surgery. Do not drink anything within 2 hours of your scheduled arrival time.  Clear liquids include: - water  - apple juice without pulp - gatorade (not RED colors) - black coffee or tea (Do NOT add milk or creamers to the coffee or tea) Do NOT drink anything that is not on this list.  In addition, your doctor has ordered for you to drink the provided:  Ensure Pre-Surgery Clear Carbohydrate Drink  Drinking this carbohydrate drink up to two hours before surgery helps to reduce insulin resistance and improve patient outcomes. Please complete drinking 2 hours before scheduled arrival time.  One week prior to surgery: Stop Anti-inflammatories (NSAIDS) such as Advil, Aleve, Ibuprofen, Motrin, Naproxen, Naprosyn and Aspirin  based products such as Excedrin, Goody's Powder, BC Powder. Stop ANY OVER THE COUNTER supplements until after surgery.  You may however, continue to take Tylenol  if needed for pain up until the day of surgery.  Continue taking all of your other prescription medications up until the day of surgery.  ON THE DAY OF SURGERY ONLY TAKE THESE MEDICATIONS WITH SIPS OF WATER:  diltiazem  (CARDIZEM  CD)  omeprazole (PRILOSEC)  albuterol  (VENTOLIN   HFA)  budesonide-formoterol  (SYMBICORT)   Use inhalers on the day of surgery and bring to the hospital.  No Alcohol for 24 hours before or after surgery.  No Smoking including e-cigarettes for 24 hours before surgery.  No chewable tobacco products for at least 6 hours before surgery.  No nicotine patches on the day of surgery.  Do not use any recreational drugs for at least a week (preferably 2 weeks) before your surgery.  Please be advised that the combination of cocaine and anesthesia may have negative outcomes, up to and including death. If you test positive for cocaine, your surgery will be cancelled.  On the morning of surgery brush your teeth with toothpaste and water, you may rinse your mouth with mouthwash if you wish. Do not swallow any toothpaste or mouthwash.  Use CHG Soap or wipes as directed on instruction sheet.  Do not wear jewelry, make-up, hairpins, clips or nail polish.  For welded (permanent) jewelry: bracelets, anklets, waist bands, etc.  Please have this removed prior to surgery.  If it is not removed, there is a chance that hospital personnel will need to cut it off on the day of surgery.  Do not wear lotions, powders, or perfumes.   Do not shave body hair from the neck down 48 hours before surgery.  Contact lenses, hearing aids and dentures may not be worn into surgery.  Do not bring valuables to the hospital. [facility] is not responsible for any missing/lost belongings or valuables.   Bring your C-PAP to the hospital in case  you may have to spend the night.   Notify your doctor if there is any change in your medical condition (cold, fever, infection).  Wear comfortable clothing (specific to your surgery type) to the hospital.  After surgery, you can help prevent lung complications by doing breathing exercises.  Take deep breaths and cough every 1-2 hours. Your doctor may order a device called an Incentive Spirometer to help you take deep  breaths. When coughing or sneezing, hold a pillow firmly against your incision with both hands. This is called splinting. Doing this helps protect your incision. It also decreases belly discomfort.  If you are being admitted to the hospital overnight, leave your suitcase in the car. After surgery it may be brought to your room.  In case of increased patient census, it may be necessary for you, the patient, to continue your postoperative care in the Same Day Surgery department.  If you are being discharged the day of surgery, you will not be allowed to drive home. You will need a responsible individual to drive you home and stay with you for 24 hours after surgery.   If you are taking public transportation, you will need to have a responsible individual with you.  Please call the Pre-admissions Testing Dept. at [phone] if you have any questions about these instructions.  Surgery Visitation Policy:  Patients having surgery or a procedure may have two visitors.  Children under the age of 24 must have an adult with them who is not the patient.  Merchandiser, Retail to address health-related social needs:  [email or web address]                                                                                                             Preparing for Surgery with CHLORHEXIDINE  GLUCONATE (CHG) Soap  Chlorhexidine  Gluconate (CHG) Soap  o An antiseptic cleaner that kills germs and bonds with the skin to continue killing germs even after washing  o Used for showering the night before surgery and morning of surgery  Before surgery, you can play an important role by reducing the number of germs on your skin.  CHG (Chlorhexidine  gluconate) soap is an antiseptic cleanser which kills germs and bonds with the skin to continue killing germs even after washing.  Please do not use if you have an allergy to CHG or antibacterial soaps. If your skin becomes reddened/irritated  stop using the CHG.  1. Shower the NIGHT BEFORE SURGERY with CHG soap.  2. If you choose to wash your hair, wash your hair first as usual with your normal shampoo.  3. After shampooing, rinse your hair and body thoroughly to remove the shampoo.  4. Use CHG as you would any other liquid soap. You can apply CHG directly to the skin and wash gently with a clean washcloth.  5. Apply the CHG soap to your body only from the neck down. Do not use on open wounds or open sores. Avoid contact with your eyes, ears, mouth, and genitals (private parts). Wash face and genitals (  private parts) with your normal soap.  6. Wash thoroughly, paying special attention to the area where your surgery will be performed.  7. Thoroughly rinse your body with warm water.  8. Do not shower/wash with your normal soap after using and rinsing off the CHG soap.  9. Do not use lotions, oils, etc., after showering with CHG.   10. Pat yourself dry with a clean towel.  11. Wear clean pajamas to bed the night before surgery.  12. Place clean sheets on your bed the night of your shower and do not sleep with pets.  13. Do not apply any deodorants/lotions/powders.  14. Please wear clean clothes to the hospital.  15. Remember to brush your teeth with your regular toothpaste.   How to Use an Incentive Spirometer  An incentive spirometer is a tool that measures how well you are filling your lungs with each breath. Learning to take long, deep breaths using this tool can help you keep your lungs clear and active. This may help to reverse or lessen your chance of developing breathing (pulmonary) problems, especially infection. You may be asked to use a spirometer: After a surgery. If you have a lung problem or a history of smoking. After a long period of time when you have been unable to move or be active. If the spirometer includes an indicator to show the highest number that you have reached, your health care provider or  respiratory therapist will help you set a goal. Keep a log of your progress as told by your health care provider. What are the risks? Breathing too quickly may cause dizziness or cause you to pass out. Take your time so you do not get dizzy or light-headed. If you are in pain, you may need to take pain medicine before doing incentive spirometry. It is harder to take a deep breath if you are having pain. How to use your incentive spirometer  Sit up on the edge of your bed or on a chair. Hold the incentive spirometer so that it is in an upright position. Before you use the spirometer, breathe out normally. Place the mouthpiece in your mouth. Make sure your lips are closed tightly around it. Breathe in slowly and as deeply as you can through your mouth, causing the piston or the ball to rise toward the top of the chamber. Hold your breath for 3-5 seconds, or for as long as possible. If the spirometer includes a coach indicator, use this to guide you in breathing. Slow down your breathing if the indicator goes above the marked areas. Remove the mouthpiece from your mouth and breathe out normally. The piston or ball will return to the bottom of the chamber. Rest for a few seconds, then repeat the steps 10 or more times. Take your time and take a few normal breaths between deep breaths so that you do not get dizzy or light-headed. Do this every 1-2 hours when you are awake. If the spirometer includes a goal marker to show the highest number you have reached (best effort), use this as a goal to work toward during each repetition. After each set of 10 deep breaths, cough a few times. This will help to make sure that your lungs are clear. If you have an incision on your chest or abdomen from surgery, place a pillow or a rolled-up towel firmly against the incision when you cough. This can help to reduce pain while taking deep breaths and coughing. General tips When you are  able to get out of bed: Walk  around often. Continue to take deep breaths and cough in order to clear your lungs. Keep using the incentive spirometer until your health care provider says it is okay to stop using it. If you have been in the hospital, you may be told to keep using the spirometer at home. Contact a health care provider if: You are having difficulty using the spirometer. You have trouble using the spirometer as often as instructed. Your pain medicine is not giving enough relief for you to use the spirometer as told. You have a fever. Get help right away if: You develop shortness of breath. You develop a cough with bloody mucus from the lungs. You have fluid or blood coming from an incision site after you cough. Summary An incentive spirometer is a tool that can help you learn to take long, deep breaths to keep your lungs clear and active. You may be asked to use a spirometer after a surgery, if you have a lung problem or a history of smoking, or if you have been inactive for a long period of time. Use your incentive spirometer as instructed every 1-2 hours while you are awake. If you have an incision on your chest or abdomen, place a pillow or a rolled-up towel firmly against your incision when you cough. This will help to reduce pain. Get help right away if you have shortness of breath, you cough up bloody mucus, or blood comes from your incision when you cough. This information is not intended to replace advice given to you by your health care provider. Make sure you discuss any questions you have with your health care provider. Document Revised: 09/12/2023 Document Reviewed: 09/12/2023 Elsevier Patient Education  2024 [employer].

## 2024-11-10 MED ORDER — POTASSIUM CHLORIDE CRYS ER 20 MEQ PO TBCR: EXTENDED_RELEASE_TABLET | ORAL | 0 refills | Status: AC

## 2024-11-18 MED ORDER — ORAL CARE MOUTH RINSE: 15.0000 mL | Freq: Once | OROMUCOSAL | Status: AC

## 2024-11-18 MED ORDER — LACTATED RINGERS IV SOLN: INTRAVENOUS | Status: DC

## 2024-11-18 MED ORDER — CEFAZOLIN SODIUM-DEXTROSE 2-4 GM/100ML-% IV SOLN
2.0000 g | INTRAVENOUS | Status: AC
  Administered 2024-11-19: 2 g via INTRAVENOUS

## 2024-11-18 MED ORDER — CHLORHEXIDINE GLUCONATE 0.12 % MT SOLN
15.0000 mL | Freq: Once | OROMUCOSAL | Status: AC
  Administered 2024-11-19: 15 mL via OROMUCOSAL

## 2024-11-19 ENCOUNTER — Ambulatory Visit: Payer: Self-pay | Admitting: Urgent Care

## 2024-11-19 ENCOUNTER — Encounter: Payer: Self-pay | Admitting: Podiatry

## 2024-11-19 ENCOUNTER — Encounter
Admission: RE | Disposition: A | Discharge status: Home / Self Care | Payer: Self-pay | Source: Home / Self Care | Attending: Podiatry

## 2024-11-19 ENCOUNTER — Other Ambulatory Visit: Payer: Self-pay

## 2024-11-19 ENCOUNTER — Ambulatory Visit
Admission: RE | Admit: 2024-11-19 | Discharge: 2024-11-19 | Disposition: A | Discharge status: Home / Self Care | Attending: Podiatry | Admitting: Podiatry

## 2024-11-19 DIAGNOSIS — Z79899 Other long term (current) drug therapy: Secondary | ICD-10-CM | POA: Diagnosis not present

## 2024-11-19 DIAGNOSIS — M86172 Other acute osteomyelitis, left ankle and foot: Secondary | ICD-10-CM | POA: Insufficient documentation

## 2024-11-19 DIAGNOSIS — I10 Essential (primary) hypertension: Secondary | ICD-10-CM | POA: Diagnosis not present

## 2024-11-19 DIAGNOSIS — J45909 Unspecified asthma, uncomplicated: Secondary | ICD-10-CM | POA: Diagnosis not present

## 2024-11-19 DIAGNOSIS — K219 Gastro-esophageal reflux disease without esophagitis: Secondary | ICD-10-CM | POA: Diagnosis not present

## 2024-11-19 DIAGNOSIS — Z01812 Encounter for preprocedural laboratory examination: Secondary | ICD-10-CM

## 2024-11-19 DIAGNOSIS — T502X5A Adverse effect of carbonic-anhydrase inhibitors, benzothiadiazides and other diuretics, initial encounter: Secondary | ICD-10-CM

## 2024-11-19 DIAGNOSIS — L089 Local infection of the skin and subcutaneous tissue, unspecified: Secondary | ICD-10-CM

## 2024-11-19 DIAGNOSIS — K449 Diaphragmatic hernia without obstruction or gangrene: Secondary | ICD-10-CM | POA: Insufficient documentation

## 2024-11-19 HISTORY — PX: AMPUTATION TOE: SHX6595

## 2024-11-19 LAB — POCT I-STAT, CHEM 8
BUN: 26 mg/dL — ABNORMAL HIGH (ref 8–23)
Calcium, Ion: 1.08 mmol/L — ABNORMAL LOW (ref 1.15–1.40)
Chloride: 99 mmol/L (ref 98–111)
Creatinine, Ser: 1.4 mg/dL — ABNORMAL HIGH (ref 0.44–1.00)
Glucose, Bld: 97 mg/dL (ref 70–99)
HCT: 42 % (ref 36.0–46.0)
Hemoglobin: 14.3 g/dL (ref 12.0–15.0)
Potassium: 3.2 mmol/L — ABNORMAL LOW (ref 3.5–5.1)
Sodium: 135 mmol/L (ref 135–145)
TCO2: 28 mmol/L (ref 22–32)

## 2024-11-19 SURGERY — AMPUTATION, TOE
Anesthesia: General | Site: Toe | Laterality: Left

## 2024-11-19 MED ORDER — PROPOFOL 10 MG/ML IV BOLUS
INTRAVENOUS | Status: DC | PRN
  Administered 2024-11-19: 100 ug/kg/min via INTRAVENOUS
  Administered 2024-11-19: 30 mg via INTRAVENOUS

## 2024-11-19 MED ORDER — OXYCODONE HCL 5 MG PO TABS: 5.0000 mg | ORAL_TABLET | Freq: Four times a day (QID) | ORAL | 0 refills | Status: AC | PRN

## 2024-11-19 MED ORDER — FENTANYL CITRATE (PF) 100 MCG/2ML IJ SOLN: 25.0000 ug | INTRAMUSCULAR | Status: DC | PRN

## 2024-11-19 MED ORDER — BUPIVACAINE HCL 0.5 % IJ SOLN
INTRAMUSCULAR | Status: DC | PRN
  Administered 2024-11-19: 3 mL

## 2024-11-19 MED ORDER — ONDANSETRON HCL 4 MG/2ML IJ SOLN: 4.0000 mg | Freq: Once | INTRAMUSCULAR | Status: DC | PRN

## 2024-11-19 MED ORDER — PROPOFOL 10 MG/ML IV BOLUS
INTRAVENOUS | Status: AC
  Filled 2024-11-19: qty 20

## 2024-11-19 MED ORDER — CEFAZOLIN SODIUM-DEXTROSE 2-4 GM/100ML-% IV SOLN
INTRAVENOUS | Status: AC
  Filled 2024-11-19: qty 100

## 2024-11-19 MED ORDER — LIDOCAINE HCL (PF) 1 % IJ SOLN
INTRAMUSCULAR | Status: DC | PRN
  Administered 2024-11-19: 3 mL

## 2024-11-19 MED ORDER — OXYCODONE HCL 5 MG PO TABS: 5.0000 mg | ORAL_TABLET | Freq: Once | ORAL | Status: DC | PRN

## 2024-11-19 MED ORDER — EPHEDRINE SULFATE-NACL 50-0.9 MG/10ML-% IV SOSY
PREFILLED_SYRINGE | INTRAVENOUS | Status: DC | PRN
  Administered 2024-11-19 (×2): 10 mg via INTRAVENOUS

## 2024-11-19 MED ORDER — CHLORHEXIDINE GLUCONATE 0.12 % MT SOLN
OROMUCOSAL | Status: AC
  Filled 2024-11-19: qty 15

## 2024-11-19 MED ORDER — FENTANYL CITRATE (PF) 100 MCG/2ML IJ SOLN
INTRAMUSCULAR | Status: AC
  Filled 2024-11-19: qty 2

## 2024-11-19 MED ORDER — 0.9 % SODIUM CHLORIDE (POUR BTL) OPTIME
TOPICAL | Status: DC | PRN
  Administered 2024-11-19: 500 mL

## 2024-11-19 MED ORDER — ONDANSETRON HCL 4 MG/2ML IJ SOLN
INTRAMUSCULAR | Status: DC | PRN
  Administered 2024-11-19: 4 mg via INTRAVENOUS

## 2024-11-19 MED ORDER — DEXMEDETOMIDINE HCL IN NACL 80 MCG/20ML IV SOLN
INTRAVENOUS | Status: DC | PRN
  Administered 2024-11-19: 8 ug via INTRAVENOUS

## 2024-11-19 MED ORDER — OXYCODONE HCL 5 MG/5ML PO SOLN: 5.0000 mg | Freq: Once | ORAL | Status: DC | PRN

## 2024-11-19 MED ORDER — BUPIVACAINE LIPOSOME 1.3 % IJ SUSP
INTRAMUSCULAR | Status: AC
  Filled 2024-11-19: qty 10

## 2024-11-19 MED ORDER — FENTANYL CITRATE (PF) 100 MCG/2ML IJ SOLN
INTRAMUSCULAR | Status: DC | PRN
  Administered 2024-11-19: 50 ug via INTRAVENOUS
  Administered 2024-11-19 (×2): 25 ug via INTRAVENOUS

## 2024-11-19 MED ORDER — ACETAMINOPHEN 10 MG/ML IV SOLN
INTRAVENOUS | Status: DC | PRN
  Administered 2024-11-19: 1000 mg via INTRAVENOUS

## 2024-11-19 MED ORDER — BUPIVACAINE HCL (PF) 0.5 % IJ SOLN
INTRAMUSCULAR | Status: AC
  Filled 2024-11-19: qty 30

## 2024-11-19 SURGICAL SUPPLY — 42 items
BLADE OSC/SAGITTAL MD 5.5X18 (BLADE) ×1 IMPLANT
BLADE SURG MINI STRL (BLADE) ×1 IMPLANT
BNDG COHESIVE 4X5 TAN STRL LF (GAUZE/BANDAGES/DRESSINGS) ×1 IMPLANT
BNDG ELASTIC 4X5.8 VLCR NS LF (GAUZE/BANDAGES/DRESSINGS) ×1 IMPLANT
BNDG ESMARCH 4X12 STRL LF (GAUZE/BANDAGES/DRESSINGS) ×1 IMPLANT
BNDG GAUZE DERMACEA FLUFF 4 (GAUZE/BANDAGES/DRESSINGS) ×1 IMPLANT
BNDG STRETCH GAUZE 3IN X12FT (GAUZE/BANDAGES/DRESSINGS) ×2 IMPLANT
CUFF TOURN SGL QUICK 12 (TOURNIQUET CUFF) IMPLANT
CUFF TOURN SGL QUICK 18X4 (TOURNIQUET CUFF) IMPLANT
DRAPE FLUOR MINI C-ARM 54X84 (DRAPES) ×1 IMPLANT
DRAPE XRAY CASSETTE 23X24 (DRAPES) IMPLANT
DURAPREP 26ML APPLICATOR (WOUND CARE) ×1 IMPLANT
ELECTRODE REM PT RTRN 9FT ADLT (ELECTROSURGICAL) ×1 IMPLANT
GAUZE PACKING IODOFORM 1/2INX (GAUZE/BANDAGES/DRESSINGS) ×1 IMPLANT
GAUZE SPONGE 4X4 12PLY STRL (GAUZE/BANDAGES/DRESSINGS) ×1 IMPLANT
GAUZE STRETCH 2X75IN STRL (MISCELLANEOUS) ×1 IMPLANT
GAUZE XEROFORM 1X8 LF (GAUZE/BANDAGES/DRESSINGS) ×1 IMPLANT
GLOVE BIO SURGEON STRL SZ7.5 (GLOVE) ×1 IMPLANT
GLOVE BIOGEL PI IND STRL 8 (GLOVE) ×1 IMPLANT
GOWN STRL REUS W/ TWL XL LVL3 (GOWN DISPOSABLE) ×1 IMPLANT
GOWN STRL REUS W/TWL XL LVL4 (GOWN DISPOSABLE) ×1 IMPLANT
KIT TURNOVER KIT A (KITS) ×1 IMPLANT
LABEL OR SOLS (LABEL) ×1 IMPLANT
MANIFOLD NEPTUNE II (INSTRUMENTS) ×1 IMPLANT
NDL SAFETY ECLIP 18X1.5 (MISCELLANEOUS) ×1 IMPLANT
NEEDLE HYPO 25X1 1.5 SAFETY (NEEDLE) ×1 IMPLANT
NS IRRIG 500ML POUR BTL (IV SOLUTION) ×1 IMPLANT
PACK EXTREMITY ARMC (MISCELLANEOUS) ×1 IMPLANT
PAD ABD DERMACEA PRESS 5X9 (GAUZE/BANDAGES/DRESSINGS) ×2 IMPLANT
PENCIL SMOKE EVACUATOR (MISCELLANEOUS) ×1 IMPLANT
SHIELD FULL FACE ANTIFOG 7M (MISCELLANEOUS) ×1 IMPLANT
SOL .9 NS 3000ML IRR UROMATIC (IV SOLUTION) ×1 IMPLANT
SOLN STERILE WATER 500 ML (IV SOLUTION) ×1 IMPLANT
STOCKINETTE IMPERV 14X48 (MISCELLANEOUS) ×1 IMPLANT
STOCKINETTE M/LG 89821 (MISCELLANEOUS) ×1 IMPLANT
STRAP SAFETY 5IN WIDE (MISCELLANEOUS) ×1 IMPLANT
SUT ETHILON 5-0 FS-2 18 BLK (SUTURE) ×1 IMPLANT
SUTURE EHLN 3-0 FS-10 30 BLK (SUTURE) ×1 IMPLANT
SWAB CULTURE AMIES ANAERIB BLU (MISCELLANEOUS) IMPLANT
SYR 10ML LL (SYRINGE) ×3 IMPLANT
TIP FAN IRRIG PULSAVAC PLUS (DISPOSABLE) ×1 IMPLANT
TRAP FLUID SMOKE EVACUATOR (MISCELLANEOUS) ×1 IMPLANT

## 2024-11-19 NOTE — Anesthesia Preprocedure Evaluation (Addendum)
 "                                  Anesthesia Evaluation  Patient identified by MRN, date of birth, ID band Patient awake    Reviewed: Allergy & Precautions, NPO status , Patient's Chart, lab work & pertinent test results  History of Anesthesia Complications (+) PONV and history of anesthetic complications  Airway Mallampati: III  TM Distance: <3 FB Neck ROM: full    Dental  (+) Poor Dentition   Pulmonary neg pulmonary ROS, asthma    Pulmonary exam normal        Cardiovascular Exercise Tolerance: Good hypertension, Pt. on medications + dysrhythmias Supra Ventricular Tachycardia  Rhythm:Regular Rate:Normal     Neuro/Psych  Headaches negative neurological ROS  negative psych ROS   GI/Hepatic negative GI ROS, Neg liver ROS, hiatal hernia,GERD  Medicated,,  Endo/Other  negative endocrine ROS    Renal/GU negative Renal ROS  negative genitourinary   Musculoskeletal   Abdominal Normal abdominal exam  (+)   Peds negative pediatric ROS (+)  Hematology negative hematology ROS (+)   Anesthesia Other Findings Past Medical History: No date: Arthritis No date: Asthma No date: Basal cell carcinoma     Comment:  face No date: Cancer (HCC)     Comment:  SKIN CANCER-BASAL CELL No date: Complication of anesthesia No date: Dysrhythmia     Comment:  PACs, SVT No date: Foot fracture, left     Comment:  WEARING BRACE No date: GERD (gastroesophageal reflux disease) No date: Headache     Comment:  H/O MIGRAINES No date: History of hiatal hernia No date: Hyperlipidemia No date: Hypertension No date: IBS (irritable bowel syndrome) No date: Osteoporosis No date: PONV (postoperative nausea and vomiting) No date: Shortness of breath dyspnea No date: Wheezing     Comment:  OCC  Past Surgical History: No date: ABDOMINAL HYSTERECTOMY 04/27/2020: BONE EXCISION; Left     Comment:  Procedure: PARTIAL EXCISION BONE-PHALANX;  Surgeon:               Lilli Cough, DPM;  Location: [facility];                Service: Podiatry;  Laterality: Left;  IVA LOCAL 02/05/2016: CATARACT EXTRACTION W/PHACO; Right     Comment:  Procedure: CATARACT EXTRACTION PHACO AND INTRAOCULAR               LENS PLACEMENT (IOC);  Surgeon: Steven Dingeldein, MD;                Location: [facility] OR[facility];  Service: Ophthalmology;  Laterality:              Right;  US  01:02 AP% 23.9 CDE 25.0 fluid pack lot #               [id number] 10/26/2020: CATARACT EXTRACTION W/PHACO; Left     Comment:  Procedure: CATARACT EXTRACTION PHACO AND INTRAOCULAR               LENS PLACEMENT (IOC) LEFT 6.73 00:37.8;  Surgeon:               Jaye Fallow, MD;  Location: [facility];                Service: Ophthalmology;  Laterality: Left; No date: colonoscopy with polypectomy 02/03/2019: COLONOSCOPY WITH PROPOFOL ; N/A     Comment:  Procedure:  COLONOSCOPY WITH PROPOFOL ;  Surgeon: Viktoria Lamar DASEN, MD;  Location: [facility];  Service:               Endoscopy;  Laterality: N/A; No date: ESOPHAGOGASTRODUODENOSCOPY No date: FOOT SURGERY No date: HAND SURGERY No date: HIP ARTHROPLASTY; Right 02/01/2022: INTRAMEDULLARY (IM) NAIL INTERTROCHANTERIC; Right     Comment:  Procedure: INTRAMEDULLARY (IM) NAIL INTERTROCHANTRIC;                Surgeon: Edie Norleen PARAS, MD;  Location: [facility];                Service: Orthopedics;  Laterality: Right; No date: MOUTH SURGERY No date: NASAL SINUS SURGERY 11/26/2019: ORIF ELBOW FRACTURE; Left     Comment:  Procedure: OPEN REDUCTION INTERNAL FIXATION (ORIF)               ELBOW/OLECRANON FRACTURE;  Surgeon: Mardee Lynwood SQUIBB, MD;               Location: [facility];  Service: Orthopedics;  Laterality:               Left; No date: TONSILLECTOMY  BMI    Body Mass Index: 26.56 kg/m      Reproductive/Obstetrics negative OB ROS                              Anesthesia Physical Anesthesia Plan  ASA:  3  Anesthesia Plan: General   Post-op Pain Management:    Induction: Intravenous  PONV Risk Score and Plan: Propofol  infusion and TIVA  Airway Management Planned: Natural Airway and Nasal Cannula  Additional Equipment:   Intra-op Plan:   Post-operative Plan:   Informed Consent: I have reviewed the patients History and Physical, chart, labs and discussed the procedure including the risks, benefits and alternatives for the proposed anesthesia with the patient or authorized representative who has indicated his/her understanding and acceptance.     Dental Advisory Given  Plan Discussed with: CRNA  Anesthesia Plan Comments:          Anesthesia Quick Evaluation  "

## 2024-11-19 NOTE — Op Note (Signed)
 Operative note   Surgeon:Tameka Hoiland [employer]/support/administrative Officer: None    Preop diagnosis: Healing ulcer with possible osteomyelitis distal left great toe    Postop diagnosis: Same    Procedure: Amputation distal phalanx left great toe at interphalangeal joint    EBL: Minimal    Anesthesia:local and IV sedation.  Local consists of a total of 6 cc of one-to-one mixture of 1% lidocaine  plain bupivacaine  and 0.5% Marcaine     Hemostasis: None    Specimen: Distal phalanx for pathology and wound culture for routine culture    Complications: None    Operative indications:Tracy Harvey is an 79 y.o. that presents today for surgical intervention.  The risks/benefits/alternatives/complications have been discussed and consent has been given.    Procedure:  Patient was brought into the OR and placed on the operating table in thesupine position. After anesthesia was obtained theleft lower extremity was prepped and draped in usual sterile fashion.  Attention was directed to the left great toe where at the level of the interphalangeal joint to full-thickness flaps were created dorsal and plantar.  The more plantar flap was more distal as the proximal flap was at the level of the interphalangeal joint.  The distal phalanx was disarticulated at the interphalangeal joint and removed from the surgical field in toto.  The proximal margin was covered with ink.  A deep wound culture was then performed.  The residual head of the proximal phalanx looked to be intact without breakdown of the articular cartilage and the bone was grossly normal.  After final flush the skin was then closed with a 3-0 nylon.  And a bulky sterile dressing was applied.    Patient tolerated the procedure and anesthesia well.  Was transported from the OR to the PACU with all vital signs stable and vascular status intact. To be discharged per routine protocol.  Will follow up in approximately 1 week in the outpatient clinic.

## 2024-11-19 NOTE — Transfer of Care (Signed)
 Immediate Anesthesia Transfer of Care Note  Patient: Tracy Harvey  Procedure(s) Performed: AMPUTATION, TOE (Left: Toe)  Patient Location: PACU  Anesthesia Type:General  Level of Consciousness: awake  Airway & Oxygen Therapy: Patient Spontanous Breathing  Post-op Assessment: Report given to RN  Post vital signs: Reviewed and stable  Last Vitals:  Vitals Value Taken Time  BP 122/57 11/19/24 13:34  Temp 36.6 C 11/19/24 13:33  Pulse 57 11/19/24 13:37  Resp 24 11/19/24 13:37  SpO2 95 % 11/19/24 13:37  Vitals shown include unfiled device data.  Last Pain:  Vitals:   11/19/24 1110  TempSrc: Temporal  PainSc: 0-No pain         Complications: No notable events documented.

## 2024-11-19 NOTE — H&P (Signed)
 HISTORY AND PHYSICAL INTERVAL NOTE:  11/19/2024  12:36 PM  Tracy Harvey  has presented today for surgery, with the diagnosis of Acute osteomyelitis of toe, left HCC M86.172.  The various methods of treatment have been discussed with the patient.  No guarantees were given.  After consideration of risks, benefits and other options for treatment, the patient has consented to surgery.  I have reviewed the patients chart and labs.     A history and physical examination was performed in my office.  The patient was reexamined.  There have been no changes to this history and physical examination.  Ashley Soulier A

## 2024-11-19 NOTE — Discharge Instructions (Addendum)

## 2024-11-19 NOTE — Anesthesia Procedure Notes (Signed)
 Procedure Name: MAC Date/Time: 11/19/2024 1:00 PM  Performed by: Germaine Maeola CROME, CRNAPre-anesthesia Checklist: Patient identified, Emergency Drugs available, Suction available, Patient being monitored and Timeout performed Patient Re-evaluated:Patient Re-evaluated prior to induction Oxygen Delivery Method: Nasal cannula and Simple face mask Induction Type: IV induction Placement Confirmation: positive ETCO2 and CO2 detector

## 2024-11-19 NOTE — Anesthesia Postprocedure Evaluation (Signed)
"   Anesthesia Post Note  Patient: Tracy Harvey  Procedure(s) Performed: AMPUTATION, TOE (Left: Toe)  Patient location during evaluation: PACU Anesthesia Type: General Level of consciousness: awake Pain management: satisfactory to patient Vital Signs Assessment: post-procedure vital signs reviewed and stable Respiratory status: spontaneous breathing Cardiovascular status: stable Anesthetic complications: no   No notable events documented.   Last Vitals:  Vitals:   11/19/24 1400 11/19/24 1414  BP: 132/69 (!) 134/58  Pulse: (!) 58 80  Resp: 16 17  Temp:  36.5 C  SpO2: 95% 94%    Last Pain:  Vitals:   11/19/24 1414  TempSrc: Temporal  PainSc: 0-No pain                 VAN STAVEREN,Calynn Ferrero      "

## 2024-11-22 ENCOUNTER — Encounter: Payer: Self-pay | Admitting: Podiatry

## 2024-11-23 LAB — SURGICAL PATHOLOGY

## 2024-11-24 LAB — AEROBIC/ANAEROBIC CULTURE W GRAM STAIN (SURGICAL/DEEP WOUND): Culture: NO GROWTH

## 2024-11-28 ENCOUNTER — Encounter: Payer: Self-pay | Admitting: Podiatry
